# Patient Record
Sex: Female | Born: 1975
Health system: Southern US, Community
[De-identification: ages and names within clinical notes are randomized; demographics above are authoritative.]

## PROBLEM LIST (undated history)

## (undated) DIAGNOSIS — J45909 Unspecified asthma, uncomplicated: Secondary | ICD-10-CM

## (undated) DIAGNOSIS — R569 Unspecified convulsions: Secondary | ICD-10-CM

## (undated) HISTORY — DX: Unspecified asthma, uncomplicated: J45.909

## (undated) HISTORY — PX: CHOLECYSTECTOMY: SHX55

## (undated) HISTORY — PX: TUBAL LIGATION: SHX77

## (undated) HISTORY — DX: Unspecified convulsions: R56.9

---

## 1998-03-21 ENCOUNTER — Inpatient Hospital Stay (HOSPITAL_COMMUNITY): Admission: AD | Admit: 1998-03-21 | Discharge: 1998-03-21 | Payer: Self-pay | Admitting: *Deleted

## 1998-03-22 ENCOUNTER — Ambulatory Visit (HOSPITAL_COMMUNITY): Admission: RE | Admit: 1998-03-22 | Discharge: 1998-03-22 | Payer: Self-pay | Admitting: *Deleted

## 1998-03-23 ENCOUNTER — Inpatient Hospital Stay (HOSPITAL_COMMUNITY): Admission: AD | Admit: 1998-03-23 | Discharge: 1998-03-23 | Payer: Self-pay | Admitting: Obstetrics & Gynecology

## 1998-03-30 ENCOUNTER — Ambulatory Visit (HOSPITAL_COMMUNITY): Admission: RE | Admit: 1998-03-30 | Discharge: 1998-03-30 | Payer: Self-pay | Admitting: *Deleted

## 1999-01-29 ENCOUNTER — Emergency Department (HOSPITAL_COMMUNITY): Admission: EM | Admit: 1999-01-29 | Discharge: 1999-01-29 | Payer: Self-pay | Admitting: Emergency Medicine

## 1999-09-14 ENCOUNTER — Emergency Department (HOSPITAL_COMMUNITY): Admission: EM | Admit: 1999-09-14 | Discharge: 1999-09-14 | Payer: Self-pay | Admitting: Emergency Medicine

## 2000-03-19 ENCOUNTER — Inpatient Hospital Stay (HOSPITAL_COMMUNITY): Admission: AD | Admit: 2000-03-19 | Discharge: 2000-03-19 | Payer: Self-pay | Admitting: *Deleted

## 2000-03-25 ENCOUNTER — Ambulatory Visit (HOSPITAL_COMMUNITY): Admission: RE | Admit: 2000-03-25 | Discharge: 2000-03-25 | Payer: Self-pay | Admitting: *Deleted

## 2000-08-21 ENCOUNTER — Inpatient Hospital Stay (HOSPITAL_COMMUNITY): Admission: AD | Admit: 2000-08-21 | Discharge: 2000-08-21 | Payer: Self-pay | Admitting: *Deleted

## 2000-08-25 ENCOUNTER — Ambulatory Visit (HOSPITAL_COMMUNITY): Admission: RE | Admit: 2000-08-25 | Discharge: 2000-08-25 | Payer: Self-pay | Admitting: Obstetrics & Gynecology

## 2000-08-28 ENCOUNTER — Inpatient Hospital Stay (HOSPITAL_COMMUNITY): Admission: AD | Admit: 2000-08-28 | Discharge: 2000-08-28 | Payer: Self-pay | Admitting: Obstetrics & Gynecology

## 2000-08-30 ENCOUNTER — Inpatient Hospital Stay (HOSPITAL_COMMUNITY): Admission: AD | Admit: 2000-08-30 | Discharge: 2000-09-02 | Payer: Self-pay | Admitting: Obstetrics

## 2001-10-01 ENCOUNTER — Inpatient Hospital Stay (HOSPITAL_COMMUNITY): Admission: AD | Admit: 2001-10-01 | Discharge: 2001-10-01 | Payer: Self-pay | Admitting: Obstetrics & Gynecology

## 2019-06-28 ENCOUNTER — Ambulatory Visit: Payer: Self-pay | Attending: Nurse Practitioner | Admitting: Nurse Practitioner

## 2019-09-27 ENCOUNTER — Encounter: Payer: Self-pay | Admitting: Family Medicine

## 2019-09-27 ENCOUNTER — Ambulatory Visit: Payer: Self-pay | Attending: Family Medicine | Admitting: Family Medicine

## 2019-09-27 ENCOUNTER — Other Ambulatory Visit: Payer: Self-pay

## 2019-09-27 VITALS — BP 107/72 | HR 109 | Temp 98.0°F | Ht 59.0 in | Wt 183.0 lb

## 2019-09-27 DIAGNOSIS — J4540 Moderate persistent asthma, uncomplicated: Secondary | ICD-10-CM

## 2019-09-27 DIAGNOSIS — R569 Unspecified convulsions: Secondary | ICD-10-CM

## 2019-09-27 DIAGNOSIS — M25511 Pain in right shoulder: Secondary | ICD-10-CM

## 2019-09-27 DIAGNOSIS — G4489 Other headache syndrome: Secondary | ICD-10-CM

## 2019-09-27 DIAGNOSIS — G40909 Epilepsy, unspecified, not intractable, without status epilepticus: Secondary | ICD-10-CM | POA: Insufficient documentation

## 2019-09-27 DIAGNOSIS — J45909 Unspecified asthma, uncomplicated: Secondary | ICD-10-CM | POA: Insufficient documentation

## 2019-09-27 MED ORDER — FLUTICASONE-SALMETEROL 250-50 MCG/DOSE IN AEPB: 1.0000 | INHALATION_SPRAY | Freq: Two times a day (BID) | RESPIRATORY_TRACT | 3 refills | Status: DC

## 2019-09-27 MED ORDER — TOPIRAMATE 50 MG PO TABS: 50.0000 mg | ORAL_TABLET | Freq: Two times a day (BID) | ORAL | 3 refills | Status: DC

## 2019-09-27 MED ORDER — DICLOFENAC SODIUM 75 MG PO TBEC: 75.0000 mg | DELAYED_RELEASE_TABLET | Freq: Two times a day (BID) | ORAL | 0 refills | Status: DC

## 2019-09-27 MED ORDER — LEVETIRACETAM 750 MG PO TABS: 750.0000 mg | ORAL_TABLET | Freq: Two times a day (BID) | ORAL | 3 refills | Status: DC

## 2019-09-27 MED ORDER — ALBUTEROL SULFATE HFA 108 (90 BASE) MCG/ACT IN AERS: 2.0000 | INHALATION_SPRAY | Freq: Four times a day (QID) | RESPIRATORY_TRACT | 3 refills | Status: DC | PRN

## 2019-09-27 MED FILL — levETIRAcetam 750 MG TABS: 750 | 30 days supply | Qty: 60 | Fill #0

## 2019-09-27 MED FILL — TOPIRAMATE 50 MG TABLET: 50 | 30 days supply | Qty: 60 | Fill #0

## 2019-09-27 MED FILL — !PROVENTIL HFA 90 MCG INH: 108 (90 BAS | 25 days supply | Qty: 7 | Fill #0

## 2019-09-27 MED FILL — !ADVAIR 250/50 DISKUS: 250-50 | 30 days supply | Qty: 60 | Fill #0

## 2019-09-27 MED FILL — DICLOFENAC SOD EC 75 MG TAB: 75 | 30 days supply | Qty: 60 | Fill #0

## 2019-09-27 NOTE — Progress Notes (Signed)
Patient is having pain in right arm and also having pain in feet and lower back.  Patient has been having headaches with blurred vision.

## 2019-09-27 NOTE — Patient Instructions (Addendum)
Please commence Topamax for your headaches and Diclofenac for your right shoulder pain.

## 2019-09-27 NOTE — Progress Notes (Signed)
Subjective:  Patient ID: Amber Clarke, female    DOB: 1975-12-13  Age: 43 y.o. MRN: [id number]  CC: New Patient (Initial Visit)   HPI Amber Clarke is a 43 year old female with a h/o Asthma, Seizure here to establish care. Relocated from [place] 3 months ago. She usually has absence seizures; none in the last 6 months. Complains of headaches for one month that make her vision blurry and this would last for 2 days with associated photophobia, phonobia. Had migraines in the past but was never on medication (previously tried imitrex but had sinus pain). Asthma is controlled on albuterol and Advair and she rarely has flares.. She has had anterior and axillary right shoulder pain with some tenderness on range of motion but no history of trauma.  Pain is described as mild She had a visit with her PCP prior to her relocating here and had a physical and Pap smear as well as labs.  Past Medical History:  Diagnosis Date  . Asthma   . Seizures (HCC)       Family History  Problem Relation Age of Onset  . Hypertension Mother   . Diabetes Mother   . COPD Mother   . Hypertension Father   . Heart disease Sister   . Heart disease Maternal Grandmother     Allergies  Allergen Reactions  . Keflex [Cephalexin]     Outpatient Medications Prior to Visit  Medication Sig Dispense Refill  . albuterol (ACCUNEB) 1.25 MG/3ML nebulizer solution Take 1 ampule by nebulization every 6 (six) hours as needed for wheezing.    Marland Kitchen albuterol (PROVENTIL HFA) 108 (90 Base) MCG/ACT inhaler Inhale into the lungs every 6 (six) hours as needed for wheezing or shortness of breath.    . levETIRAcetam (KEPPRA) 750 MG tablet Take 750 mg by mouth 2 (two) times daily.     No facility-administered medications prior to visit.     ROS Review of Systems  Constitutional: Negative for activity change, appetite change and fatigue.  HENT: Negative for congestion, sinus pressure and sore throat.   Eyes: Negative for  visual disturbance.  Respiratory: Negative for cough, chest tightness, shortness of breath and wheezing.   Cardiovascular: Negative for chest pain and palpitations.  Gastrointestinal: Negative for abdominal distention, abdominal pain and constipation.  Endocrine: Negative for polydipsia.  Genitourinary: Negative for dysuria and frequency.  Musculoskeletal: Negative for arthralgias and back pain.  Skin: Negative for rash.  Neurological: Negative for tremors, light-headedness and numbness.  Hematological: Does not bruise/bleed easily.  Psychiatric/Behavioral: Negative for agitation and behavioral problems.    Objective:  BP 107/72   Pulse (!) 109   Temp 98 F (36.7 C) (Oral)   Ht 4\' 11"  (1.499 m)   Wt 183 lb (83 kg)   SpO2 93%   BMI 36.96 kg/m   BP/Weight 09/27/2019  Systolic BP 107  Diastolic BP 72  Wt. (Lbs) 183  BMI 36.96      Physical Exam Constitutional:      Appearance: She is well-developed.  Neck:     Vascular: No JVD.  Cardiovascular:     Rate and Rhythm: Normal rate.     Heart sounds: Normal heart sounds. No murmur.  Pulmonary:     Effort: Pulmonary effort is normal.     Breath sounds: Normal breath sounds. No wheezing or rales.  Chest:     Chest wall: No tenderness.  Abdominal:     General: Bowel sounds are normal. There is no distension.  Palpations: Abdomen is soft. There is no mass.     Tenderness: There is no abdominal tenderness.  Musculoskeletal:        General: Normal range of motion.     Right lower leg: No edema.     Left lower leg: No edema.     Comments: Right shoulder appears normal, slight tenderness to palpation in the anterior and axillary region Hawkins and Neer signs are normal Hand grip is normal  Neurological:     Mental Status: She is alert and oriented to person, place, and time.  Psychiatric:        Mood and Affect: Mood normal.       Assessment & Plan:   1. Moderate persistent asthma without  complication Controlled No regimen change today - albuterol (ACCUNEB) 1.25 MG/3ML nebulizer solution; Take 1 ampule by nebulization every 6 (six) hours as needed for wheezing. - Fluticasone-Salmeterol (ADVAIR DISKUS) 250-50 MCG/DOSE AEPB; Inhale 1 puff into the lungs 2 (two) times daily.  Dispense: 1 each; Refill: 3 - albuterol (PROVENTIL HFA) 108 (90 Base) MCG/ACT inhaler; Inhale 2 puffs into the lungs every 6 (six) hours as needed for wheezing or shortness of breath.  Dispense: 18 g; Refill: 3  2. Seizures ([place]) Stable Advised of [place] driving laws-no driving until 6 months seizure-free - levETIRAcetam (KEPPRA) 750 MG tablet; Take 1 tablet (750 mg total) by mouth 2 (two) times daily.  Dispense: 60 tablet; Refill: 3  3. Other headache syndrome We will treat for migraines Commence Topamax-side effects discussed; she is status post tubal ligation - topiramate (TOPAMAX) 50 MG tablet; Take 1 tablet (50 mg total) by mouth 2 (two) times daily.  Dispense: 60 tablet; Refill: 3  4. Acute pain of right shoulder Cannot exclude early underlying osteoarthritis - diclofenac (VOLTAREN) 75 MG EC tablet; Take 1 tablet (75 mg total) by mouth 2 (two) times daily.  Dispense: 60 tablet; Refill: 0  Health Care Maintenance: Up-to-date per patient with previous PCP.  Will request records. No orders of the defined types were placed in this encounter.   Follow-up: Return in about 3 months (around 12/26/2019) for Medical conditions.       Charlott Rakes, MD, FAAFP. [facility] and [place], [place]   09/27/2019, 2:20 PM

## 2019-10-26 ENCOUNTER — Ambulatory Visit: Payer: Self-pay | Attending: Internal Medicine

## 2019-10-26 DIAGNOSIS — Z20822 Contact with and (suspected) exposure to covid-19: Secondary | ICD-10-CM | POA: Insufficient documentation

## 2019-10-28 ENCOUNTER — Other Ambulatory Visit: Payer: Self-pay

## 2019-10-28 LAB — NOVEL CORONAVIRUS, NAA: SARS-CoV-2, NAA: NOT DETECTED

## 2019-11-08 MED FILL — !PROVENTIL HFA 90 MCG INH: 108 (90 BAS | 25 days supply | Qty: 7 | Fill #1

## 2019-11-08 MED FILL — levETIRAcetam 750 MG TABS: 750 | 30 days supply | Qty: 60 | Fill #1

## 2019-12-08 ENCOUNTER — Ambulatory Visit: Payer: Self-pay

## 2019-12-28 ENCOUNTER — Ambulatory Visit: Payer: Medicaid Other | Attending: Family Medicine | Admitting: Family Medicine

## 2019-12-28 ENCOUNTER — Other Ambulatory Visit: Payer: Self-pay

## 2019-12-28 DIAGNOSIS — J4540 Moderate persistent asthma, uncomplicated: Secondary | ICD-10-CM

## 2019-12-28 DIAGNOSIS — G4489 Other headache syndrome: Secondary | ICD-10-CM

## 2019-12-28 DIAGNOSIS — M25512 Pain in left shoulder: Secondary | ICD-10-CM | POA: Diagnosis not present

## 2019-12-28 DIAGNOSIS — R569 Unspecified convulsions: Secondary | ICD-10-CM

## 2019-12-28 DIAGNOSIS — G8929 Other chronic pain: Secondary | ICD-10-CM

## 2019-12-28 MED ORDER — FLUTICASONE-SALMETEROL 250-50 MCG/DOSE IN AEPB: 1.0000 | INHALATION_SPRAY | Freq: Two times a day (BID) | RESPIRATORY_TRACT | 6 refills | Status: DC

## 2019-12-28 MED ORDER — PREDNISONE 20 MG PO TABS: 20.0000 mg | ORAL_TABLET | Freq: Every day | ORAL | 0 refills | Status: DC

## 2019-12-28 MED ORDER — TIZANIDINE HCL 4 MG PO TABS: 4.0000 mg | ORAL_TABLET | Freq: Four times a day (QID) | ORAL | 2 refills | Status: DC | PRN

## 2019-12-28 MED ORDER — LEVETIRACETAM 750 MG PO TABS: 750.0000 mg | ORAL_TABLET | Freq: Two times a day (BID) | ORAL | 6 refills | Status: DC

## 2019-12-28 MED ORDER — ALBUTEROL SULFATE HFA 108 (90 BASE) MCG/ACT IN AERS: 2.0000 | INHALATION_SPRAY | Freq: Four times a day (QID) | RESPIRATORY_TRACT | 6 refills | Status: DC | PRN

## 2019-12-28 MED ORDER — MONTELUKAST SODIUM 10 MG PO TABS: 10.0000 mg | ORAL_TABLET | Freq: Every day | ORAL | 6 refills | Status: DC

## 2019-12-28 MED ORDER — MELOXICAM 7.5 MG PO TABS: 7.5000 mg | ORAL_TABLET | Freq: Every day | ORAL | 2 refills | Status: DC

## 2019-12-28 NOTE — Progress Notes (Signed)
Patient has been called and DOB has been verified. Patient has been screened and transferred to PCP to start phone visit.   Needs refills on all medications. 

## 2019-12-28 NOTE — Progress Notes (Signed)
Virtual Visit via Telephone Note  I connected with Amber Clarke, on 12/28/2019 at 1:35 PM by telephone due to the COVID-19 pandemic and verified that I am speaking with the correct person using two identifiers.   Consent: I discussed the limitations, risks, security and privacy concerns of performing an evaluation and management service by telephone and the availability of in person appointments. I also discussed with the patient that there may be a patient responsible charge related to this service. The patient expressed understanding and agreed to proceed.   Location of Patient: Home  Location of Provider: Clinic   Persons participating in Telemedicine visit: Creasie Lacosse Far[place]-CMA Dr. Alvis Lemmings     History of Present Illness: She is a 44 year old female with a h/o Asthma, Seizure, migraines seen for follow-up visit. She is R handed.  At her last visit she had complained of right shoulder pain but today states it was actually her left not her right. Complains of L shoulder Pain which is 8/10 and she denies numbness, tries not to lift heavy things but denies dropping things  ROM is associated with pain. Taking sister's Meloxicam which has been ineffective but her mother's tizanidine has been effective.    Migraines are getting better and Topamax which was started at her last visit has been effective but she does not take it regularly due to side effects. Migraines occur every 3 days and she is working on lowering her stress which has been helpful in controlling her migraine. Last seizure was 9 months ago; doing well on Keppra. Complains asthma is not controlled as she has been having flares and is wondering if Advair dose can be increased.  Also wondering if she needs prednisone.  Past Medical History:  Diagnosis Date  . Asthma   . Seizures (HCC)    Allergies  Allergen Reactions  . Keflex [Cephalexin]     Current Outpatient Medications on File Prior to Visit   Medication Sig Dispense Refill  . albuterol (ACCUNEB) 1.25 MG/3ML nebulizer solution Take 1 ampule by nebulization every 6 (six) hours as needed for wheezing.    Marland Kitchen albuterol (PROVENTIL HFA) 108 (90 Base) MCG/ACT inhaler Inhale 2 puffs into the lungs every 6 (six) hours as needed for wheezing or shortness of breath. 18 g 3  . diclofenac (VOLTAREN) 75 MG EC tablet Take 1 tablet (75 mg total) by mouth 2 (two) times daily. 60 tablet 0  . Fluticasone-Salmeterol (ADVAIR DISKUS) 250-50 MCG/DOSE AEPB Inhale 1 puff into the lungs 2 (two) times daily. 1 each 3  . levETIRAcetam (KEPPRA) 750 MG tablet Take 1 tablet (750 mg total) by mouth 2 (two) times daily. 60 tablet 3  . topiramate (TOPAMAX) 50 MG tablet Take 1 tablet (50 mg total) by mouth 2 (two) times daily. (Patient not taking: Reported on 12/28/2019) 60 tablet 3   No current facility-administered medications on file prior to visit.    Observations/Objective: Awake, alert, oriented x3 Not in acute distress  Assessment and Plan: 1. Seizures (HCC) Controlled - levETIRAcetam (KEPPRA) 750 MG tablet; Take 1 tablet (750 mg total) by mouth 2 (two) times daily.  Dispense: 60 tablet; Refill: 6  2. Moderate persistent asthma without complication Uncontrolled Short course of prednisone We will add Singulair to her regimen - Fluticasone-Salmeterol (ADVAIR DISKUS) 250-50 MCG/DOSE AEPB; Inhale 1 puff into the lungs 2 (two) times daily.  Dispense: 1 each; Refill: 6 - montelukast (SINGULAIR) 10 MG tablet; Take 1 tablet (10 mg total) by mouth at bedtime.  Dispense: 30 tablet; Refill: 6 - albuterol (PROVENTIL HFA) 108 (90 Base) MCG/ACT inhaler; Inhale 2 puffs into the lungs every 6 (six) hours as needed for wheezing or shortness of breath.  Dispense: 18 g; Refill: 6 - predniSONE (DELTASONE) 20 MG tablet; Take 1 tablet (20 mg total) by mouth daily with breakfast.  Dispense: 5 tablet; Refill: 0  3. Chronic left shoulder pain Uncontrolled We will add  tizanidine and meloxicam; discontinue diclofenac Referral to PT - tiZANidine (ZANAFLEX) 4 MG tablet; Take 1 tablet (4 mg total) by mouth every 6 (six) hours as needed for muscle spasms.  Dispense: 60 tablet; Refill: 2 - meloxicam (MOBIC) 7.5 MG tablet; Take 1 tablet (7.5 mg total) by mouth daily.  Dispense: 30 tablet; Refill: 2 - Ambulatory referral to Physical Therapy  4. Other headache syndrome Not taking Topamax as prescribed Advised she can take half a tablet twice daily for prophylaxis Reducing her stress level has been beneficial Continue behavioral modifications to limit flares   Follow Up Instructions: 3 months   I discussed the assessment and treatment plan with the patient. The patient was provided an opportunity to ask questions and all were answered. The patient agreed with the plan and demonstrated an understanding of the instructions.   The patient was advised to call back or seek an in-person evaluation if the symptoms worsen or if the condition fails to improve as anticipated.     I provided 17 minutes total of non-face-to-face time during this encounter including median intraservice time, reviewing previous notes, investigations, ordering medications, medical decision making, coordinating care and patient verbalized understanding at the end of the visit.     Charlott Rakes, MD, FAAFP. [facility] and [place], [place]   12/28/2019, 1:35 PM

## 2019-12-29 ENCOUNTER — Encounter: Payer: Self-pay | Admitting: Family Medicine

## 2020-01-17 ENCOUNTER — Ambulatory Visit: Payer: Medicaid Other | Attending: Family Medicine | Admitting: Physical Therapy

## 2020-04-12 ENCOUNTER — Other Ambulatory Visit: Payer: Self-pay | Admitting: Family Medicine

## 2020-04-12 DIAGNOSIS — M25512 Pain in left shoulder: Secondary | ICD-10-CM

## 2020-04-12 NOTE — Telephone Encounter (Signed)
Requested Prescriptions  Pending Prescriptions Disp Refills  . meloxicam (MOBIC) 7.5 MG tablet [Pharmacy Med Name: MELOXICAM 7.5MG  TABLET] 30 tablet 2    Sig: TAKE 1 TABLET (7.5 MG TOTAL) BY MOUTH DAILY.     Analgesics:  COX2 Inhibitors Failed - 04/12/2020  6:01 PM      Failed - HGB in normal range and within 360 days    No results found for: HGB, HGBKUC, HGBPOCKUC, HGBOTHER, TOTHGB, HGBPLASMA       Failed - Cr in normal range and within 360 days    No results found for: CREATININE, LABCREAU, LABCREA, POCCRE       Passed - Patient is not pregnant      Passed - Valid encounter within last 12 months    Recent Outpatient Visits          3 months ago Chronic left shoulder pain   [facility] [place], Odette Horns, MD   6 months ago Other headache syndrome   [facility] [place], [place], MD             . tiZANidine (ZANAFLEX) 4 MG tablet [Pharmacy Med Name: TIZANIDINE HCL 4MG  TABLET] 60 tablet 2    Sig: TAKE 1 TABLET (4 MG TOTAL) BY MOUTH EVERY 6 (SIX) HOURS AS NEEDED FOR MUSCLE SPASMS.     Not Delegated - Cardiovascular:  Alpha-2 Agonists - tizanidine Failed - 04/12/2020  6:01 PM      Failed - This refill cannot be delegated      Passed - Valid encounter within last 6 months    Recent Outpatient Visits          3 months ago Chronic left shoulder pain   [facility] 04/14/2020, MD   6 months ago Other headache syndrome   [facility], MD

## 2020-04-12 NOTE — Telephone Encounter (Signed)
Requested medication (s) are due for refill today: yes  Requested medication (s) are on the active medication list: yes  Last refill: ?  Future visit scheduled: no  Notes to clinic:  not delegated   Requested Prescriptions  Pending Prescriptions Disp Refills   tiZANidine (ZANAFLEX) 4 MG tablet [Pharmacy Med Name: TIZANIDINE HCL 4MG  TABLET] 60 tablet 2    Sig: TAKE 1 TABLET (4 MG TOTAL) BY MOUTH EVERY 6 (SIX) HOURS AS NEEDED FOR MUSCLE SPASMS.      Not Delegated - Cardiovascular:  Alpha-2 Agonists - tizanidine Failed - 04/12/2020  6:01 PM      Failed - This refill cannot be delegated      Passed - Valid encounter within last 6 months    Recent Outpatient Visits           3 months ago Chronic left shoulder pain   [facility] [place], [place], MD   6 months ago Other headache syndrome   [facility] [place], [place], MD               Signed Prescriptions Disp Refills   meloxicam (MOBIC) 7.5 MG tablet 30 tablet 2    Sig: TAKE 1 TABLET (7.5 MG TOTAL) BY MOUTH DAILY.      Analgesics:  COX2 Inhibitors Failed - 04/12/2020  6:01 PM      Failed - HGB in normal range and within 360 days    No results found for: HGB, HGBKUC, HGBPOCKUC, HGBOTHER, TOTHGB, HGBPLASMA        Failed - Cr in normal range and within 360 days    No results found for: CREATININE, LABCREAU, LABCREA, POCCRE        Passed - Patient is not pregnant      Passed - Valid encounter within last 12 months    Recent Outpatient Visits           3 months ago Chronic left shoulder pain   [facility] [place], [place], MD   6 months ago Other headache syndrome   [facility] Odette Horns, MD

## 2020-05-30 ENCOUNTER — Ambulatory Visit: Payer: Medicaid Other | Admitting: Family Medicine

## 2020-08-23 ENCOUNTER — Other Ambulatory Visit: Payer: Self-pay | Admitting: Family Medicine

## 2020-08-23 DIAGNOSIS — M25512 Pain in left shoulder: Secondary | ICD-10-CM

## 2020-08-23 DIAGNOSIS — G8929 Other chronic pain: Secondary | ICD-10-CM

## 2020-11-03 ENCOUNTER — Other Ambulatory Visit: Payer: Self-pay | Admitting: Family Medicine

## 2020-11-03 DIAGNOSIS — M25512 Pain in left shoulder: Secondary | ICD-10-CM

## 2020-11-03 NOTE — Telephone Encounter (Signed)
Requested medication (s) are due for refill today:   Provider to determine  Requested medication (s) are on the active medication list:   Yes  Future visit scheduled:   No   Last ordered: 04/20/2020 #60, 2 refills  Clinic Note:  Non delegated refill   Requested Prescriptions  Pending Prescriptions Disp Refills   tiZANidine (ZANAFLEX) 4 MG tablet [Pharmacy Med Name: TIZANIDINE HCL 4 MG ORAL TABLET] 60 tablet 2    Sig: TAKE 1 TABLET (4 MG TOTAL) BY MOUTH EVERY 6 (SIX) HOURS AS NEEDED FOR MUSCLE SPASMS.      Not Delegated - Cardiovascular:  Alpha-2 Agonists - tizanidine Failed - 11/03/2020 12:40 PM      Failed - This refill cannot be delegated      Failed - Valid encounter within last 6 months    Recent Outpatient Visits           10 months ago Chronic left shoulder pain   [facility] Hoy Register, MD   1 year ago Other headache syndrome   [facility] Hoy Register, MD

## 2020-11-21 ENCOUNTER — Other Ambulatory Visit: Payer: Self-pay | Admitting: Family Medicine

## 2020-11-21 DIAGNOSIS — R569 Unspecified convulsions: Secondary | ICD-10-CM

## 2020-11-21 NOTE — Telephone Encounter (Signed)
Requested medication (s) are due for refill today: no  Requested medication (s) are on the active medication list:  yes  Last refill:  11/21/2020  Future visit scheduled: no  Notes to clinic:  this refill cannot be delegated    Requested Prescriptions  Pending Prescriptions Disp Refills   levETIRAcetam (KEPPRA) 750 MG tablet [Pharmacy Med Name: LEVETIRACETAM 750 MG ORAL TABLET] 60 tablet 6    Sig: Take 1 tablet (750 mg total) by mouth 2 (two) times daily.      Not Delegated - Neurology:  Anticonvulsants Failed - 11/21/2020  9:31 AM      Failed - This refill cannot be delegated      Failed - HCT in normal range and within 360 days    No results found for: HCT, HCTKUC, SRHCT        Failed - HGB in normal range and within 360 days    No results found for: HGB, HGBKUC, HGBPOCKUC, HGBOTHER, TOTHGB, HGBPLASMA        Failed - PLT in normal range and within 360 days    No results found for: PLT, PLTCOUNTKUC, LABPLAT, POCPLA        Failed - WBC in normal range and within 360 days    No results found for: WBC, WBCKUC        Passed - Valid encounter within last 12 months    Recent Outpatient Visits           10 months ago Chronic left shoulder pain   [facility] Hoy Register, MD   1 year ago Other headache syndrome   [facility] Hoy Register, MD

## 2021-01-05 ENCOUNTER — Other Ambulatory Visit: Payer: Self-pay | Admitting: Adult Medicine

## 2021-01-05 DIAGNOSIS — Z1231 Encounter for screening mammogram for malignant neoplasm of breast: Secondary | ICD-10-CM

## 2021-01-10 ENCOUNTER — Emergency Department (HOSPITAL_COMMUNITY)
Admission: EM | Admit: 2021-01-10 | Discharge: 2021-01-10 | Disposition: A | Discharge status: Home / Self Care | Payer: Medicaid Other | Attending: Emergency Medicine | Admitting: Emergency Medicine

## 2021-01-10 ENCOUNTER — Encounter (HOSPITAL_COMMUNITY): Payer: Self-pay | Admitting: Emergency Medicine

## 2021-01-10 ENCOUNTER — Emergency Department (HOSPITAL_COMMUNITY): Payer: Medicaid Other

## 2021-01-10 ENCOUNTER — Other Ambulatory Visit: Payer: Self-pay

## 2021-01-10 DIAGNOSIS — L299 Pruritus, unspecified: Secondary | ICD-10-CM | POA: Diagnosis not present

## 2021-01-10 DIAGNOSIS — R000 Tachycardia, unspecified: Secondary | ICD-10-CM | POA: Insufficient documentation

## 2021-01-10 DIAGNOSIS — J3489 Other specified disorders of nose and nasal sinuses: Secondary | ICD-10-CM | POA: Insufficient documentation

## 2021-01-10 DIAGNOSIS — F17200 Nicotine dependence, unspecified, uncomplicated: Secondary | ICD-10-CM | POA: Diagnosis not present

## 2021-01-10 DIAGNOSIS — J40 Bronchitis, not specified as acute or chronic: Secondary | ICD-10-CM | POA: Diagnosis not present

## 2021-01-10 DIAGNOSIS — R0602 Shortness of breath: Secondary | ICD-10-CM | POA: Diagnosis present

## 2021-01-10 DIAGNOSIS — Z20822 Contact with and (suspected) exposure to covid-19: Secondary | ICD-10-CM | POA: Insufficient documentation

## 2021-01-10 DIAGNOSIS — Z79899 Other long term (current) drug therapy: Secondary | ICD-10-CM | POA: Insufficient documentation

## 2021-01-10 DIAGNOSIS — J4541 Moderate persistent asthma with (acute) exacerbation: Secondary | ICD-10-CM

## 2021-01-10 DIAGNOSIS — D649 Anemia, unspecified: Secondary | ICD-10-CM

## 2021-01-10 LAB — CBC WITH DIFFERENTIAL/PLATELET
Abs Immature Granulocytes: 0.02 10*3/uL (ref 0.00–0.07)
Basophils Absolute: 0.1 10*3/uL (ref 0.0–0.1)
Basophils Relative: 1 %
Eosinophils Absolute: 0.2 10*3/uL (ref 0.0–0.5)
Eosinophils Relative: 2 %
HCT: 36 % (ref 36.0–46.0)
Hemoglobin: 10.7 g/dL — ABNORMAL LOW (ref 12.0–15.0)
Immature Granulocytes: 0 %
Lymphocytes Relative: 20 %
Lymphs Abs: 1.6 10*3/uL (ref 0.7–4.0)
MCH: 23.6 pg — ABNORMAL LOW (ref 26.0–34.0)
MCHC: 29.7 g/dL — ABNORMAL LOW (ref 30.0–36.0)
MCV: 79.3 fL — ABNORMAL LOW (ref 80.0–100.0)
Monocytes Absolute: 0.3 10*3/uL (ref 0.1–1.0)
Monocytes Relative: 4 %
Neutro Abs: 5.7 10*3/uL (ref 1.7–7.7)
Neutrophils Relative %: 73 %
Platelets: 438 10*3/uL — ABNORMAL HIGH (ref 150–400)
RBC: 4.54 MIL/uL (ref 3.87–5.11)
RDW: 19.9 % — ABNORMAL HIGH (ref 11.5–15.5)
WBC: 7.9 10*3/uL (ref 4.0–10.5)
nRBC: 0 % (ref 0.0–0.2)

## 2021-01-10 LAB — RESP PANEL BY RT-PCR (FLU A&B, COVID) ARPGX2
Influenza A by PCR: NEGATIVE
Influenza B by PCR: NEGATIVE
SARS Coronavirus 2 by RT PCR: NEGATIVE

## 2021-01-10 LAB — BASIC METABOLIC PANEL
Anion gap: 6 (ref 5–15)
BUN: 5 mg/dL — ABNORMAL LOW (ref 6–20)
CO2: 24 mmol/L (ref 22–32)
Calcium: 9 mg/dL (ref 8.9–10.3)
Chloride: 106 mmol/L (ref 98–111)
Creatinine, Ser: 0.84 mg/dL (ref 0.44–1.00)
GFR, Estimated: >60 mL/min (ref >60)
Glucose, Bld: 115 mg/dL — ABNORMAL HIGH (ref 70–99)
Potassium: 3.9 mmol/L (ref 3.5–5.1)
Sodium: 136 mmol/L (ref 135–145)

## 2021-01-10 LAB — I-STAT BETA HCG BLOOD, ED (MC, WL, AP ONLY): I-stat hCG, quantitative: <5 m[IU]/mL (ref <5)

## 2021-01-10 MED ORDER — BENZONATATE 100 MG PO CAPS
200.0000 mg | ORAL_CAPSULE | Freq: Once | ORAL | Status: AC
  Administered 2021-01-10: 200 mg via ORAL
  Filled 2021-01-10: qty 2

## 2021-01-10 MED ORDER — DOXYCYCLINE HYCLATE 100 MG PO CAPS: 100.0000 mg | ORAL_CAPSULE | Freq: Two times a day (BID) | ORAL | 0 refills | Status: DC

## 2021-01-10 MED ORDER — IPRATROPIUM BROMIDE 0.02 % IN SOLN
0.5000 mg | Freq: Once | RESPIRATORY_TRACT | Status: AC
  Administered 2021-01-10: 0.5 mg via RESPIRATORY_TRACT
  Filled 2021-01-10: qty 2.5

## 2021-01-10 MED ORDER — ALBUTEROL SULFATE HFA 108 (90 BASE) MCG/ACT IN AERS: 1.0000 | INHALATION_SPRAY | Freq: Four times a day (QID) | RESPIRATORY_TRACT | 0 refills | Status: DC | PRN

## 2021-01-10 MED ORDER — PREDNISONE 10 MG (21) PO TBPK: ORAL_TABLET | ORAL | 0 refills | Status: DC

## 2021-01-10 MED ORDER — BENZONATATE 100 MG PO CAPS: 200.0000 mg | ORAL_CAPSULE | Freq: Three times a day (TID) | ORAL | 0 refills | Status: AC

## 2021-01-10 MED ORDER — ALBUTEROL (5 MG/ML) CONTINUOUS INHALATION SOLN
10.0000 mg/h | INHALATION_SOLUTION | Freq: Once | RESPIRATORY_TRACT | Status: AC
  Administered 2021-01-10: 10 mg/h via RESPIRATORY_TRACT
  Filled 2021-01-10: qty 20

## 2021-01-10 NOTE — ED Notes (Signed)
Xray bedside.

## 2021-01-10 NOTE — Discharge Instructions (Addendum)
You were seen in the emergency department for cough, wheezing, shortness of breath  Lab work and chest x-ray are unremarkable.  Your hemoglobin is 10.7, this is lower than normal but not severe.  Please discuss this with your primary care doctor.  Respiratory panel was negative  I think your symptoms are from an asthma flare or bronchitis  Please use fluticasone nasal spray (Flonase) 1 spray in each nostril in the morning and at night.  Take an over-the-counter allergy medicine like cetirizine, loratadine, etc every morning.  Take doxycycline morning and night, this is an antibiotic that can treat bronchitis  Take and finish prednisone taper as prescribed  For cough, take over-the-counter dextromethorphan (Delsym) and take every morning and evening for 5 days.  Take tessalon perles 200 mg every 8 hours.    Continue using albuterol nebulizer treatment every 4 hours for the next 2 days.  Supplement with albuterol inhaler as needed  Call your primary care doctor and schedule an appointment in the next 48 to 72 hours if symptoms have not significantly improved  Return to the ED for worsening symptoms  Good job on cutting back on cigarette use, encouraged you to completely stop smoking

## 2021-01-10 NOTE — ED Triage Notes (Signed)
Per EMS, pt from work, SOB X90 minutes, rescue inhaler not helping asthma.  Given 10 albuterol, .5 Atrovent, 125 Solumedrol.  Initially wheezing in on fields now only left sided wheezing.  Pt does not appear to be in any respiratory distress at this time.

## 2021-01-10 NOTE — ED Provider Notes (Signed)
[facility] EMERGENCY DEPARTMENT Provider Note   CSN: [id number] Arrival date & time: 01/10/21  3734     History Chief Complaint  Patient presents with  . Shortness of Breath    Amber Clarke is a 45 y.o. female with past medical history of asthma, tobacco use presents to the ED for evaluation of "asthma".  Endorses shortness of breath, wheezing, productive cough for the last week.  Cough is productive of green phlegm.  Cough is worse at night.  Associated with runny nose, itchy eyes and irritated throat that she attributes to constant coughing.  Does have seasonal allergies that typically worsen at this time of the year.  Has tried to cut back on cigarette use, from 10 cigarettes a day now to 2-3 a day.  Unvaccinated for COVID but had Covid May 2021.  She had a at home Covid test 3 days ago that was negative.  No sick contacts.  No recent travel.  No hemoptysis.  No fevers, chills.  No chest pain or tightness.  No vomiting, diarrhea, abdominal pain.  Patient has albuterol nebulizer and inhaler at home.  Has been using albuterol nebulizing every morning and at night and albuterol inhaler "obsessively" throughout the day.  This has not been helping.  Reports requiring admission for asthma many years ago.  Has never required ICU admission or intubations.  Patient symptoms worsen in the last 90 minutes while at work.  EMS was called.  She was given 10 mg albuterol, 0.5 mg Atrovent, 100 [place] Solu-Medrol.  Reports some improvement about 50% better.  Per EMS wheezing has improved as well on repeat auscultation.   HPI     Past Medical History:  Diagnosis Date  . Asthma   . Seizures [facility])     Patient Active Problem List   Diagnosis Date Noted  . Asthma   . Seizures (HCC)     Past Surgical History:  Procedure Laterality Date  . CHOLECYSTECTOMY    . TUBAL LIGATION       OB History   No obstetric history on file.     Family History  Problem Relation Age of Onset   . Hypertension Mother   . Diabetes Mother   . COPD Mother   . Hypertension Father   . Heart disease Sister   . Heart disease Maternal Grandmother     Social History   Tobacco Use  . Smoking status: Current Every Day Smoker  . Smokeless tobacco: Never Used  Substance Use Topics  . Alcohol use: Not Currently  . Drug use: Not Currently    Home Medications Prior to Admission medications   Medication Sig Start Date End Date Taking? Authorizing Provider  albuterol (PROVENTIL HFA) 108 (90 Base) MCG/ACT inhaler Inhale 2 puffs into the lungs every 6 (six) hours as needed for wheezing or shortness of breath. 12/28/19  Yes Hoy Register, MD  albuterol (VENTOLIN HFA) 108 (90 Base) MCG/ACT inhaler Inhale 1-2 puffs into the lungs every 6 (six) hours as needed for wheezing or shortness of breath. 01/10/21  Yes Sharen Heck J, PA-C  benzonatate (TESSALON PERLES) 100 MG capsule Take 2 capsules (200 mg total) by mouth 3 (three) times daily for 5 days. FOR DISRUPTIVE DAY TIME COUGH 01/10/21 01/15/21 Yes Liberty Handy, PA-C  diclofenac Sodium (VOLTAREN) 1 % GEL Apply 2-4 g topically daily. 12/21/20  Yes [provider]  doxycycline (VIBRAMYCIN) 100 MG capsule Take 1 capsule (100 mg total) by mouth 2 (two) times  daily. 01/10/21  Yes Liberty Handy, PA-C  famotidine (PEPCID) 20 MG tablet Take 20 mg by mouth 2 (two) times daily. 12/26/20  Yes [provider]  gabapentin (NEURONTIN) 300 MG capsule Take 300 mg by mouth 2 (two) times daily. 12/26/20  Yes [provider]  HYDROcodone-acetaminophen (NORCO/VICODIN) 5-325 MG tablet Take 1 tablet by mouth 2 (two) times daily. 12/21/20  Yes [provider]  ipratropium-albuterol (DUONEB) 0.5-2.5 (3) MG/3ML SOLN Take 3 mLs by nebulization 4 (four) times daily. 12/26/20  Yes [provider]  levETIRAcetam (KEPPRA) 750 MG tablet Take 1 tablet (750 mg total) by mouth 2 (two) times daily. 12/28/19  Yes Newlin, Odette Horns, MD   meloxicam (MOBIC) 7.5 MG tablet TAKE 1 TABLET (7.5 MG TOTAL) BY MOUTH DAILY. 08/23/20  Yes Newlin, Odette Horns, MD  montelukast (SINGULAIR) 10 MG tablet Take 1 tablet (10 mg total) by mouth at bedtime. Patient taking differently: Take 10 mg by mouth at bedtime as needed (allergies). 12/28/19  Yes Hoy Register, MD  omeprazole (PRILOSEC) 20 MG capsule Take 20 mg by mouth daily. 12/26/20  Yes [provider]  predniSONE (STERAPRED UNI-PAK 21 TAB) 10 MG (21) TBPK tablet Take prednisone taper as prescribed and until completed 01/10/21  Yes Mearl Harewood J, PA-C  tiZANidine (ZANAFLEX) 4 MG tablet TAKE 1 TABLET (4 MG TOTAL) BY MOUTH EVERY 6 (SIX) HOURS AS NEEDED FOR MUSCLE SPASMS. 04/20/20  Yes Newlin, Enobong, MD  azithromycin (ZITHROMAX) 250 MG tablet Take 250 mg by mouth See admin instructions. 2 tabs day 1. 1 tab days 2-5 Patient not taking: Reported on 01/10/2021 12/26/20   [provider]  DULERA 100-5 MCG/ACT AERO Inhale 2 puffs into the lungs in the morning and at bedtime. Patient not taking: No sig reported 10/10/20   [provider]  Fluticasone-Salmeterol (ADVAIR DISKUS) 250-50 MCG/DOSE AEPB Inhale 1 puff into the lungs 2 (two) times daily. Patient not taking: No sig reported 12/28/19   Hoy Register, MD  predniSONE (DELTASONE) 20 MG tablet Take 1 tablet (20 mg total) by mouth daily with breakfast. Patient not taking: No sig reported 12/28/19   Hoy Register, MD  topiramate (TOPAMAX) 50 MG tablet Take 1 tablet (50 mg total) by mouth 2 (two) times daily. Patient not taking: No sig reported 09/27/19   Hoy Register, MD    Allergies    Keflex [cephalexin] and Pollen extract  Review of Systems   Review of Systems  HENT: Positive for congestion, postnasal drip, rhinorrhea and sore throat.   Respiratory: Positive for cough, shortness of breath and wheezing.   All other systems reviewed and are negative.   Physical Exam Updated Vital Signs BP 123/60   Pulse (!)  116   Temp 97.6 F (36.4 C) (Oral)   Resp (!) 21   Ht 4\' 11"  (1.499 m)   Wt 83 kg   LMP  (LMP Unknown)   SpO2 95%   BMI 36.96 kg/m   Physical Exam Vitals and nursing note reviewed.  Constitutional:      General: She is not in acute distress.    Appearance: She is well-developed.     Comments: NAD.  HENT:     Head: Normocephalic and atraumatic.     Right Ear: External ear normal.     Left Ear: External ear normal.     Nose: Congestion and rhinorrhea present. Rhinorrhea is clear.     Right Turbinates: Enlarged.     Left Turbinates: Enlarged.     Mouth/Throat:  Mouth: No angioedema.     Pharynx: Oropharynx is clear.     Tonsils: No tonsillar exudate or tonsillar abscesses. 0 on the right. 0 on the left.  Eyes:     General: No scleral icterus.    Conjunctiva/sclera: Conjunctivae normal.  Cardiovascular:     Rate and Rhythm: Regular rhythm. Tachycardia present.     Heart sounds: Normal heart sounds. No murmur heard.   Pulmonary:     Effort: Tachypnea present.     Breath sounds: Decreased air movement present. Wheezing present.  Musculoskeletal:        General: No deformity. Normal range of motion.     Cervical back: Normal range of motion and neck supple.  Skin:    General: Skin is warm and dry.     Capillary Refill: Capillary refill takes less than 2 seconds.  Neurological:     Mental Status: She is alert and oriented to person, place, and time.  Psychiatric:        Behavior: Behavior normal.        Thought Content: Thought content normal.        Judgment: Judgment normal.     ED Results / Procedures / Treatments   Labs (all labs ordered are listed, but only abnormal results are displayed) Labs Reviewed  BASIC METABOLIC PANEL - Abnormal; Notable for the following components:      Result Value   Glucose, Bld 115 (*)    BUN 5 (*)    All other components within normal limits  CBC WITH DIFFERENTIAL/PLATELET - Abnormal; Notable for the following components:    Hemoglobin 10.7 (*)    MCV 79.3 (*)    MCH 23.6 (*)    MCHC 29.7 (*)    RDW 19.9 (*)    Platelets 438 (*)    All other components within normal limits  RESP PANEL BY RT-PCR (FLU A&B, COVID) [id number]  I-STAT BETA HCG BLOOD, ED ([facility], [facility], [facility] ONLY)    EKG EKG Interpretation  Date/Time:  Wednesday January 10 2021 19:23:02 EDT Ventricular Rate:  109 PR Interval:  134 QRS Duration: 68 QT Interval:  328 QTC Calculation: 442 R Axis:   122 Text Interpretation: Sinus tachycardia Right axis deviation No prior ECG for comparison. No STEMI Confirmed by Theda Belfast ([id number]) on 01/10/2021 7:34:48 PM   Radiology DG Chest Port 1 View  Result Date: 01/10/2021 CLINICAL DATA:  Shortness of breath for 2 hours EXAM: PORTABLE CHEST 1 VIEW COMPARISON:  None. FINDINGS: The heart size and mediastinal contours are within normal limits. Both lungs are clear. The visualized skeletal structures are unremarkable. IMPRESSION: No active disease. Electronically Signed   By: Alcide Clever M.D.   On: 01/10/2021 20:27    Procedures Procedures   Medications Ordered in ED Medications  albuterol (PROVENTIL,VENTOLIN) solution continuous neb (10 mg/hr Nebulization Given 01/10/21 2103)  ipratropium (ATROVENT) nebulizer solution 0.5 mg (0.5 mg Nebulization Given 01/10/21 2103)    ED Course  I have reviewed the triage vital signs and the nursing notes.  Pertinent labs & imaging results that were available during my care of the patient were reviewed by me and considered in my medical decision making (see chart for details).    MDM Rules/Calculators/A&P                          45 year old female presents to the ED for shortness of breath, productive cough, wheezing.  Feels like previous asthma flares.  Has been cutting back on cigarette use.  Symptoms refractory to home albuterol nebulizer and inhaler.  EMR, triage nursing notes reviewed  Additional information obtained from EMS.  Wheezing has improved after breathing  treatments, Solu-Medrol on route.    Highest on DDx is asthma exacerbation, bronchitis.  Probably has some underlying COPD.  As she has had symptoms for 1 week without fever, chills.    Pneumonia less likely doubt PE, ACS in the setting.  Lab work, imaging ordered by me as above.  Lab work, imaging personally visualized and interpreted  Lab work reveals stable anemia.  No leukocytosis.  Respiratory panel is negative.  Imaging reveals -chest x-ray without acute findings.  EKG shows sinus tachycardia.   Medicines ordered-continues albuterol nebulizing treatment, Atrovent, tessalon perles.   Patient reevaluated after treatment.  Reports "100%" improvement in her symptoms.  Repeat auscultation reveals significant improvement in wheezing.  Tachypnea has resolved.  Patient appropriate for discharge.  Will treat for asthma exacerbation/bronchitis in setting of tobacco use will prescribe prednisone taper, Tessalon Perles, Delsym, doxycycline.  Also recommended Flonase, antihistamine.  Return precautions discussed.  Patient is comfortable with this plan. Final Clinical Impression(s) / ED Diagnoses Final diagnoses:  Moderate persistent asthma with exacerbation  Bronchitis    Rx / DC Orders ED Discharge Orders         Ordered    doxycycline (VIBRAMYCIN) 100 MG capsule  2 times daily        01/10/21 2244    benzonatate (TESSALON PERLES) 100 MG capsule  3 times daily        01/10/21 2244    predniSONE (STERAPRED UNI-PAK 21 TAB) 10 MG (21) TBPK tablet        01/10/21 2244    albuterol (VENTOLIN HFA) 108 (90 Base) MCG/ACT inhaler  Every 6 hours PRN        01/10/21 2244           Liberty HandyGibbons, Tyreonna Czaplicki J, PA-C 01/10/21 2245    Tegeler, Canary Brimhristopher J, MD 01/11/21 [phone]

## 2021-01-10 NOTE — ED Notes (Signed)
Requested respiratory give contin. Neb.

## 2021-03-30 ENCOUNTER — Other Ambulatory Visit: Payer: Self-pay | Admitting: Family Medicine

## 2021-03-30 DIAGNOSIS — Z1231 Encounter for screening mammogram for malignant neoplasm of breast: Secondary | ICD-10-CM

## 2021-04-18 ENCOUNTER — Encounter (HOSPITAL_COMMUNITY): Payer: Self-pay

## 2021-04-18 ENCOUNTER — Emergency Department (HOSPITAL_COMMUNITY)
Admission: EM | Admit: 2021-04-18 | Discharge: 2021-04-18 | Disposition: A | Discharge status: Home / Self Care | Payer: Medicaid Other | Attending: Emergency Medicine | Admitting: Emergency Medicine

## 2021-04-18 ENCOUNTER — Other Ambulatory Visit: Payer: Self-pay

## 2021-04-18 DIAGNOSIS — Z8616 Personal history of COVID-19: Secondary | ICD-10-CM | POA: Insufficient documentation

## 2021-04-18 DIAGNOSIS — F17210 Nicotine dependence, cigarettes, uncomplicated: Secondary | ICD-10-CM | POA: Diagnosis not present

## 2021-04-18 DIAGNOSIS — J9801 Acute bronchospasm: Secondary | ICD-10-CM | POA: Insufficient documentation

## 2021-04-18 DIAGNOSIS — R0602 Shortness of breath: Secondary | ICD-10-CM | POA: Diagnosis present

## 2021-04-18 MED ORDER — PREDNISONE 10 MG PO TABS: ORAL_TABLET | ORAL | 0 refills | Status: DC

## 2021-04-18 MED ORDER — ALBUTEROL SULFATE HFA 108 (90 BASE) MCG/ACT IN AERS
2.0000 | INHALATION_SPRAY | RESPIRATORY_TRACT | Status: DC | PRN
  Administered 2021-04-18: 2 via RESPIRATORY_TRACT
  Filled 2021-04-18: qty 6.7

## 2021-04-18 NOTE — ED Triage Notes (Signed)
Patient BIB [facility] from home. Patient has asthma and took 7 duonebs without relief feeling short of breath all day. Had Covid in May.  EMS Gave 125mg  solumedrol 1 duoneb 5 albuterol 0.5 atrovent Room air sats 94 110/70 HR 120

## 2021-04-18 NOTE — ED Provider Notes (Signed)
[facility]-EMERGENCY DEPT Provider Note: Lowella Dell, MD, FACEP  CSN: [id number] MRN: [id number] ARRIVAL: 04/18/21 at 0310 ROOM: WA10/WA10   CHIEF COMPLAINT  Shortness of Breath   HISTORY OF PRESENT ILLNESS  04/18/21 3:26 AM Amber Clarke is a 45 y.o. female with a history of asthma.  She had COVID in May of this year.  She called EMS after having an exacerbation of asthma since yesterday.  She had taken 7 duo nebs throughout the day without adequate relief.  EMS administered albuterol and ipratropium by neb prior to arrival and also administered 125 mg of Solu-Medrol IV.  She states her shortness of breath was severe when she was at home and was worse with attempts to ambulate or get things done.  She has not had a fever or any GI symptoms.  She has had a dry cough with this.  She attributes this attack to the weather.   Past Medical History:  Diagnosis Date   Asthma    Seizures (HCC)     Past Surgical History:  Procedure Laterality Date   CHOLECYSTECTOMY     TUBAL LIGATION      Family History  Problem Relation Age of Onset   Hypertension Mother    Diabetes Mother    COPD Mother    Hypertension Father    Heart disease Sister    Heart disease Maternal Grandmother     Social History   Tobacco Use   Smoking status: Every Day    Pack years: 0.00    Types: Cigarettes   Smokeless tobacco: Never  Substance Use Topics   Alcohol use: Not Currently   Drug use: Not Currently    Prior to Admission medications   Medication Sig Start Date End Date Taking? Authorizing Provider  predniSONE (DELTASONE) 10 MG tablet Take 2 tablets (40 mg) daily for 4 days starting 04/19/2021. 04/18/21  Yes Leandria Thier, MD  albuterol (PROVENTIL HFA) 108 (90 Base) MCG/ACT inhaler Inhale 2 puffs into the lungs every 6 (six) hours as needed for wheezing or shortness of breath. 12/28/19   Hoy Register, MD  diclofenac Sodium (VOLTAREN) 1 % GEL Apply 2-4 g topically daily. 12/21/20   [provider]  famotidine (PEPCID) 20 MG tablet Take 20 mg by mouth 2 (two) times daily. 12/26/20   [provider]  gabapentin (NEURONTIN) 300 MG capsule Take 300 mg by mouth 2 (two) times daily. 12/26/20   [provider]  HYDROcodone-acetaminophen (NORCO/VICODIN) 5-325 MG tablet Take 1 tablet by mouth 2 (two) times daily. 12/21/20   [provider]  ipratropium-albuterol (DUONEB) 0.5-2.5 (3) MG/3ML SOLN Take 3 mLs by nebulization 4 (four) times daily. 12/26/20   [provider]  levETIRAcetam (KEPPRA) 750 MG tablet Take 1 tablet (750 mg total) by mouth 2 (two) times daily. 12/28/19   Hoy Register, MD  meloxicam (MOBIC) 7.5 MG tablet TAKE 1 TABLET (7.5 MG TOTAL) BY MOUTH DAILY. 08/23/20   Hoy Register, MD  montelukast (SINGULAIR) 10 MG tablet Take 1 tablet (10 mg total) by mouth at bedtime. Patient taking differently: Take 10 mg by mouth at bedtime as needed (allergies). 12/28/19   Hoy Register, MD  omeprazole (PRILOSEC) 20 MG capsule Take 20 mg by mouth daily. 12/26/20   [provider]  tiZANidine (ZANAFLEX) 4 MG tablet TAKE 1 TABLET (4 MG TOTAL) BY MOUTH EVERY 6 (SIX) HOURS AS NEEDED FOR MUSCLE SPASMS. 04/20/20   Hoy Register, MD    Allergies Keflex [cephalexin] and Pollen extract  REVIEW OF SYSTEMS  Negative except as noted here or in the History of Present Illness.   PHYSICAL EXAMINATION  Initial Vital Signs Blood pressure 132/63, pulse (!) 111, temperature 98.7 F (37.1 C), temperature source Oral, resp. rate 19, height 4\' 11"  (1.499 m), weight 83 kg, SpO2 99 %.  Examination General: Well-developed, well-nourished female in no acute distress; appearance consistent with age of record HENT: normocephalic; atraumatic Eyes: Normal appearance Neck: supple Heart: regular rate and rhythm; tachycardia Lungs: clear to auscultation bilaterally; frequent cough Abdomen: soft; nondistended; nontender; no masses or hepatosplenomegaly; bowel sounds  present Extremities: No deformity; full range of motion; pulses normal Neurologic: Awake, alert and oriented; motor function intact in all extremities and symmetric; no facial droop Skin: Warm and dry Psychiatric: Normal mood and affect   RESULTS  Summary of this visit's results, reviewed and interpreted by myself:   EKG Interpretation  Date/Time:    Ventricular Rate:    PR Interval:    QRS Duration:   QT Interval:    QTC Calculation:   R Axis:     Text Interpretation:          Laboratory Studies: No results found for this or any previous visit (from the past 24 hour(s)). Imaging Studies: No results found.  ED COURSE and MDM  Nursing notes, initial and subsequent vitals signs, including pulse oximetry, reviewed and interpreted by myself.  Vitals:   04/18/21 0325 04/18/21 0330 04/18/21 0415 04/18/21 0435  BP: 120/73 (!) 118/54  107/77  Pulse: (!) 115 (!) 123 (!) 113 (!) 109  Resp: (!) 26 (!) 24 20 (!) 22  Temp:      TempSrc:      SpO2: 99% 100% 92% 90%  Weight:      Height:       Medications  albuterol (VENTOLIN HFA) 108 (90 Base) MCG/ACT inhaler 2 puff (has no administration in time range)   5:17 AM Patient resting comfortably without tachypnea or increased work of breathing.  Patient has only faint expiratory wheezes.  She feels much improved and ready to go home.  Her oxygen saturation was reading 88% but when the pulse oximeter was repositioned on her finger it came up to 95% on room air.  Her inhaler is out and we will refill it.   PROCEDURES  Procedures   ED DIAGNOSES     ICD-10-CM   1. Acute bronchospasm  J98.01          Hilma Steinhilber, 06/19/21, MD 04/18/21 [id number]

## 2021-05-24 ENCOUNTER — Ambulatory Visit: Payer: Medicaid Other

## 2021-05-31 ENCOUNTER — Ambulatory Visit: Payer: Medicaid Other | Attending: Physical Medicine and Rehabilitation

## 2021-10-10 ENCOUNTER — Emergency Department (HOSPITAL_COMMUNITY)
Admission: EM | Admit: 2021-10-10 | Discharge: 2021-10-10 | Discharge status: Against Medical Advice | Payer: Medicaid Other

## 2021-10-10 NOTE — ED Notes (Signed)
Called 3x for triage, no answer 

## 2021-12-14 ENCOUNTER — Encounter: Payer: Self-pay | Admitting: Student

## 2021-12-14 ENCOUNTER — Other Ambulatory Visit: Payer: Self-pay

## 2021-12-14 ENCOUNTER — Ambulatory Visit (INDEPENDENT_AMBULATORY_CARE_PROVIDER_SITE_OTHER): Payer: Medicaid Other

## 2021-12-14 ENCOUNTER — Ambulatory Visit (INDEPENDENT_AMBULATORY_CARE_PROVIDER_SITE_OTHER): Payer: Medicaid Other | Admitting: Student

## 2021-12-14 ENCOUNTER — Encounter: Payer: Self-pay | Admitting: *Deleted

## 2021-12-14 VITALS — BP 102/60 | HR 115 | Temp 98.4°F | Ht 59.0 in | Wt 178.8 lb

## 2021-12-14 DIAGNOSIS — J4540 Moderate persistent asthma, uncomplicated: Secondary | ICD-10-CM

## 2021-12-14 DIAGNOSIS — R053 Chronic cough: Secondary | ICD-10-CM

## 2021-12-14 MED ORDER — NYSTATIN 100000 UNIT/ML MT SUSP
5.0000 mL | Freq: Four times a day (QID) | OROMUCOSAL | 0 refills | Status: AC
Start: 2021-12-14 — End: 2021-12-28

## 2021-12-14 MED ORDER — SPACER/AERO-HOLDING CHAMBERS DEVI
1.0000 | Freq: Two times a day (BID) | 0 refills | Status: AC
Start: 2021-12-14 — End: ?

## 2021-12-14 MED ORDER — PREDNISONE 2.5 MG PO TABS
ORAL_TABLET | ORAL | 0 refills | Status: DC
Start: 2021-12-14 — End: 2022-02-06

## 2021-12-14 NOTE — Patient Instructions (Addendum)
-   Steroid taper see instructions ?- Breztri 2 puffs twice daily with spacer rinse mouth and spacer after use ?- neti pot rinse with distilled water or boiled then cooled water and salt pack - see instructions. This is for sinus congestion, crusting, and post-nasal drainage ?- try using flonase after this 1 spray each nostril ?- can add zyrtec or claritin daily over the counter if allergy real bad ?- x ray and labs today ? ? ?

## 2021-12-14 NOTE — Progress Notes (Signed)
? ?Synopsis: Referred for asthma by Jackie Plum, MD ? ?Subjective:  ? ?PATIENT ID: Amber Clarke GENDER: female DOB: Aug 23, 1976, MRN: [id number] ? ?Chief Complaint  ?Patient presents with  ? Pulmonary Consult  ?  Referred by Dr. Greggory Stallion Osei-Bonsu. Pt states she was dx with Asthma as a child and for the past 3 years it has been hard to control. She also c/o cough and wheezing. Cough is currently prod with green/yellow sputum.   ? ?45yF with history of asthma, seizures. Had covid in 02/2021 wasn't severe. She is not vaccinated for covid-19. ? ?She has been on breo since December taking 1 puff twice daily, not rinsing mouth afterward.  ? ?She has a very bad cough. Productive cough. Has been bad since Nov of last year. She does have sinus congestion, nasal drainage. She is on singulair has been on it for a while. She says it burns when she tried using flonase in past. SHe takes omeprazole before dinner.  ? ?She has DOE to half block maybe. Gradually worsening but breo has helped some. ? ?Inhalers tried: ?Albuterol ?Advair diskus no problems ?Dulera made her vision blurry ?Virgel Bouquet likes it ? ? ?Otherwise pertinent review of systems is negative. ? ?COPD in mother and father.  ? ?She is from [place]. Moved to [place] 3 ya. Smokes half ppd. 2 blunts MJ. No vaping. No pets.  ? ?Past Medical History:  ?Diagnosis Date  ? Asthma   ? Seizures (HCC)   ?  ? ?Family History  ?Problem Relation Age of Onset  ? Hypertension Mother   ? Diabetes Mother   ? COPD Mother   ? Asthma Mother   ? Hypertension Father   ? Asthma Father   ? Heart disease Sister   ? Heart disease Maternal Grandmother   ?  ? ?Past Surgical History:  ?Procedure Laterality Date  ? CHOLECYSTECTOMY    ? TUBAL LIGATION    ? ? ?Social History  ? ?Socioeconomic History  ? Marital status: Single  ?  Spouse name: Not on file  ? Number of children: Not on file  ? Years of education: Not on file  ? Highest education level: Not on file  ?Occupational History  ? Not on file   ?Tobacco Use  ? Smoking status: Every Day  ?  Packs/day: 0.50  ?  Years: 28.00  ?  Pack years: 14.00  ?  Types: Cigarettes  ? Smokeless tobacco: Never  ?Vaping Use  ? Vaping Use: Never used  ?Substance and Sexual Activity  ? Alcohol use: Not Currently  ? Drug use: Not Currently  ? Sexual activity: Yes  ?  Birth control/protection: None  ?Other Topics Concern  ? Not on file  ?Social History Narrative  ? Not on file  ? ?Social Determinants of Health  ? ?Financial Resource Strain: Not on file  ?Food Insecurity: Not on file  ?Transportation Needs: Not on file  ?Physical Activity: Not on file  ?Stress: Not on file  ?Social Connections: Not on file  ?Intimate Partner Violence: Not on file  ?  ? ?Allergies  ?Allergen Reactions  ? Keflex [Cephalexin]   ? Pollen Extract   ?  ? ?Outpatient Medications Prior to Visit  ?Medication Sig Dispense Refill  ? albuterol (PROVENTIL HFA) 108 (90 Base) MCG/ACT inhaler Inhale 2 puffs into the lungs every 6 (six) hours as needed for wheezing or shortness of breath. 18 g 6  ? diclofenac Sodium (VOLTAREN) 1 % GEL Apply 2-4 g topically daily.    ?  famotidine (PEPCID) 20 MG tablet Take 20 mg by mouth daily.    ? gabapentin (NEURONTIN) 300 MG capsule Take 300 mg by mouth 2 (two) times daily as needed.    ? ipratropium-albuterol (DUONEB) 0.5-2.5 (3) MG/3ML SOLN Take 3 mLs by nebulization 4 (four) times daily.    ? levETIRAcetam (KEPPRA) 750 MG tablet Take 1 tablet (750 mg total) by mouth 2 (two) times daily. 60 tablet 6  ? meloxicam (MOBIC) 7.5 MG tablet TAKE 1 TABLET (7.5 MG TOTAL) BY MOUTH DAILY. 30 tablet 0  ? montelukast (SINGULAIR) 10 MG tablet Take 1 tablet (10 mg total) by mouth at bedtime. (Patient taking differently: Take 10 mg by mouth at bedtime as needed (allergies).) 30 tablet 6  ? omeprazole (PRILOSEC) 20 MG capsule Take 20 mg by mouth daily.    ? predniSONE (DELTASONE) 20 MG tablet Take 20 mg by mouth daily with breakfast.    ? tiZANidine (ZANAFLEX) 4 MG tablet TAKE 1 TABLET (4  MG TOTAL) BY MOUTH EVERY 6 (SIX) HOURS AS NEEDED FOR MUSCLE SPASMS. 60 tablet 2  ? HYDROcodone-acetaminophen (NORCO/VICODIN) 5-325 MG tablet Take 1 tablet by mouth 2 (two) times daily.    ? predniSONE (DELTASONE) 10 MG tablet Take 2 tablets (40 mg) daily for 4 days starting 04/19/2021. 10 tablet 0  ? ?No facility-administered medications prior to visit.  ? ? ? ? ? ?Objective:  ? ?Physical Exam: ? ?General appearance: 46 y.o., female, NAD, conversant  ?Eyes: anicteric sclerae; PERRL, tracking appropriately ?HENT: NCAT; MMM ?Neck: Trachea midline; no lymphadenopathy, no JVD ?Lungs: faint wheeze right upper lung field, with normal respiratory effort ?CV: RRR, no murmur  ?Abdomen: Soft, non-tender; non-distended, BS present  ?Extremities: No peripheral edema, warm ?Skin: Normal turgor and texture; no rash ?Psych: Appropriate affect ?Neuro: Alert and oriented to person and place, no focal deficit  ? ? ? ?Vitals:  ? 12/14/21 1449  ?BP: 102/60  ?Pulse: (!) 115  ?Temp: 98.4 ?F (36.9 ?C)  ?TempSrc: Oral  ?SpO2: 96%  ?Weight: 178 lb 12.8 oz (81.1 kg)  ?Height: 4\' 11"  (1.499 m)  ? ?96% on RA ?BMI Readings from Last 3 Encounters:  ?12/14/21 36.11 kg/m?  ?04/18/21 36.96 kg/m?  ?01/10/21 36.96 kg/m?  ? ?Wt Readings from Last 3 Encounters:  ?12/14/21 178 lb 12.8 oz (81.1 kg)  ?04/18/21 183 lb (83 kg)  ?01/10/21 182 lb 15.7 oz (83 kg)  ? ? ? ?CBC ?   ?Component Value Date/Time  ? WBC 7.9 01/10/2021 2025  ? RBC 4.54 01/10/2021 2025  ? HGB 10.7 (L) 01/10/2021 2025  ? HCT 36.0 01/10/2021 2025  ? PLT 438 (H) 01/10/2021 2025  ? MCV 79.3 (L) 01/10/2021 2025  ? MCH 23.6 (L) 01/10/2021 2025  ? MCHC 29.7 (L) 01/10/2021 2025  ? RDW 19.9 (H) 01/10/2021 2025  ? LYMPHSABS 1.6 01/10/2021 2025  ? MONOABS 0.3 01/10/2021 2025  ? EOSABS 0.2 01/10/2021 2025  ? BASOSABS 0.1 01/10/2021 2025  ? ? ?Eos 200 ? ?Chest Imaging: ?CXR reviewed by me accounting for difference in penetration not significantly changed ? ?Pulmonary Functions Testing Results: ?No  flowsheet data found. ? ?   ?Assessment & Plan:  ? ?# OCS dependent severe persistent asthma:  ? ?# Smoking ? ?Plan: ?- attempt to taper off prednisone 5 week taper prescribed ?- start breztri 2 puffs twice daily with spacer rinse mouth and spacer after use ?- nystatin swish for 2 weeks ?- neti pot rinse with distilled water or boiled then  cooled water and salt pack - see instructions. This is for sinus congestion, crusting, and post-nasal drainage ?- try using flonase after this 1 spray each nostril ?- can add zyrtec or claritin daily over the counter if allergy real bad ?- PFTs once we get her off of steroids ideally  ?- smoking cessation strongly encouraged ? ?RTC 6 weeks  ? ? ?Omar Person, MD ?[facility] ?12/14/2021 2:58 PM  ? ?

## 2022-01-31 ENCOUNTER — Other Ambulatory Visit: Payer: Self-pay | Admitting: Internal Medicine

## 2022-01-31 DIAGNOSIS — Z1231 Encounter for screening mammogram for malignant neoplasm of breast: Secondary | ICD-10-CM

## 2022-02-05 NOTE — Progress Notes (Signed)
? ?Synopsis: Referred for asthma by Hoy Register, MD ? ?Subjective:  ? ?PATIENT ID: Amber Clarke GENDER: female DOB: 20-Oct-1975, MRN: [id number] ? ?Chief Complaint  ?Patient presents with  ? Follow-up  ?  PFT's done today.  Cough has been worse over the past 2 wks- prod with thick, yellow sputum. Coughing sometimes wakes her up in the night. She has had some HA and facial pressure. She is using her albuterol several times per day.   ? ?45yF with history of asthma, seizures. Had covid in 02/2021 wasn't severe. She is not vaccinated for covid-19. ? ?She has been on breo since December taking 1 puff twice daily, not rinsing mouth afterward.  ? ?She has a very bad cough. Productive cough. Has been bad since Nov of last year. She does have sinus congestion, nasal drainage. She is on singulair has been on it for a while. She says it burns when she tried using flonase in past. SHe takes omeprazole before dinner.  ? ?She has DOE to half block maybe. Gradually worsening but breo has helped some. ? ?Inhalers tried: ?Albuterol ?Advair diskus no problems ?Dulera made her vision blurry ?Virgel Bouquet likes it ? ?COPD in mother and father.  ? ?She is from [place]. Moved to [place] 3 ya. Smokes half ppd. 2 blunts MJ. No vaping. No pets.  ? ?Interval HPI: ?Exacerbation 01/29/22 requiring course of steroid. At last visit was provided prednisone taper and breztri with spacer.  ? ?She has been using her breztri with spacer, rinsing mouth after use.  ? ?Worse DOE and cough than at last visit but still recovering from this recent exacerbation.  ? ?Otherwise pertinent review of systems is negative. ? ? ? ?Past Medical History:  ?Diagnosis Date  ? Asthma   ? Seizures (HCC)   ?  ? ?Family History  ?Problem Relation Age of Onset  ? Hypertension Mother   ? Diabetes Mother   ? COPD Mother   ? Asthma Mother   ? Hypertension Father   ? Asthma Father   ? Heart disease Sister   ? Heart disease Maternal Grandmother   ?  ? ?Past Surgical History:  ?Procedure  Laterality Date  ? CHOLECYSTECTOMY    ? TUBAL LIGATION    ? ? ?Social History  ? ?Socioeconomic History  ? Marital status: Single  ?  Spouse name: Not on file  ? Number of children: Not on file  ? Years of education: Not on file  ? Highest education level: Not on file  ?Occupational History  ? Not on file  ?Tobacco Use  ? Smoking status: Every Day  ?  Packs/day: 0.50  ?  Years: 28.00  ?  Pack years: 14.00  ?  Types: Cigarettes  ? Smokeless tobacco: Never  ?Vaping Use  ? Vaping Use: Never used  ?Substance and Sexual Activity  ? Alcohol use: Not Currently  ? Drug use: Not Currently  ? Sexual activity: Yes  ?  Birth control/protection: None  ?Other Topics Concern  ? Not on file  ?Social History Narrative  ? Not on file  ? ?Social Determinants of Health  ? ?Financial Resource Strain: Not on file  ?Food Insecurity: Not on file  ?Transportation Needs: Not on file  ?Physical Activity: Not on file  ?Stress: Not on file  ?Social Connections: Not on file  ?Intimate Partner Violence: Not on file  ?  ? ?Allergies  ?Allergen Reactions  ? Keflex [Cephalexin]   ? Pollen Extract   ?  ? ?  Outpatient Medications Prior to Visit  ?Medication Sig Dispense Refill  ? albuterol (PROVENTIL HFA) 108 (90 Base) MCG/ACT inhaler Inhale 2 puffs into the lungs every 6 (six) hours as needed for wheezing or shortness of breath. 18 g 6  ? diclofenac Sodium (VOLTAREN) 1 % GEL Apply 2-4 g topically daily.    ? famotidine (PEPCID) 20 MG tablet Take 20 mg by mouth daily.    ? gabapentin (NEURONTIN) 300 MG capsule Take 300 mg by mouth 2 (two) times daily as needed.    ? ipratropium-albuterol (DUONEB) 0.5-2.5 (3) MG/3ML SOLN Take 3 mLs by nebulization 4 (four) times daily.    ? levETIRAcetam (KEPPRA) 750 MG tablet Take 1 tablet (750 mg total) by mouth 2 (two) times daily. 60 tablet 6  ? meloxicam (MOBIC) 7.5 MG tablet TAKE 1 TABLET (7.5 MG TOTAL) BY MOUTH DAILY. 30 tablet 0  ? montelukast (SINGULAIR) 10 MG tablet Take 1 tablet (10 mg total) by mouth at  bedtime. (Patient taking differently: Take 10 mg by mouth at bedtime as needed (allergies).) 30 tablet 6  ? omeprazole (PRILOSEC) 20 MG capsule Take 20 mg by mouth daily.    ? Spacer/Aero-Holding Chambers DEVI 1 each by Does not apply route in the morning and at bedtime. Please use with MDI inhalers 1 each 0  ? tiZANidine (ZANAFLEX) 4 MG tablet TAKE 1 TABLET (4 MG TOTAL) BY MOUTH EVERY 6 (SIX) HOURS AS NEEDED FOR MUSCLE SPASMS. 60 tablet 2  ? predniSONE (DELTASONE) 2.5 MG tablet Take 4 tablets daily for 1 week, 3 tablets daily for 1 week, 2 tablets daily for 1 week, 1 tablet daily for 2 weeks 77 tablet 0  ? ?No facility-administered medications prior to visit.  ? ? ? ? ? ?Objective:  ? ?Physical Exam: ? ?General appearance: 46 y.o., female, NAD, conversant  ?Eyes: anicteric sclerae; PERRL, tracking appropriately ?HENT: NCAT; MMM, hoarse ?Neck: Trachea midline; no lymphadenopathy, no JVD ?Lungs: faint wheeze right lower lung field, with normal respiratory effort ?CV: RRR, no murmur  ?Abdomen: Soft, non-tender; non-distended, BS present  ?Extremities: No peripheral edema, warm ?Skin: Normal turgor and texture; no rash ?Psych: Appropriate affect ?Neuro: Alert and oriented to person and place, no focal deficit  ? ? ? ?Vitals:  ? 02/06/22 1333  ?BP: 108/64  ?Pulse: (!) 122  ?Temp: 98 ?F (36.7 ?C)  ?TempSrc: Oral  ?SpO2: 96%  ?Weight: 180 lb 9.6 oz (81.9 kg)  ?Height: 5' (1.524 m)  ? ? ?96% on RA ?BMI Readings from Last 3 Encounters:  ?02/06/22 35.27 kg/m?  ?12/14/21 36.11 kg/m?  ?04/18/21 36.96 kg/m?  ? ?Wt Readings from Last 3 Encounters:  ?02/06/22 180 lb 9.6 oz (81.9 kg)  ?12/14/21 178 lb 12.8 oz (81.1 kg)  ?04/18/21 183 lb (83 kg)  ? ? ? ?CBC ?   ?Component Value Date/Time  ? WBC 7.9 01/10/2021 2025  ? RBC 4.54 01/10/2021 2025  ? HGB 10.7 (L) 01/10/2021 2025  ? HCT 36.0 01/10/2021 2025  ? PLT 438 (H) 01/10/2021 2025  ? MCV 79.3 (L) 01/10/2021 2025  ? MCH 23.6 (L) 01/10/2021 2025  ? MCHC 29.7 (L) 01/10/2021 2025  ?  RDW 19.9 (H) 01/10/2021 2025  ? LYMPHSABS 1.6 01/10/2021 2025  ? MONOABS 0.3 01/10/2021 2025  ? EOSABS 0.2 01/10/2021 2025  ? BASOSABS 0.1 01/10/2021 2025  ? ? ?Eos 200 ? ?Chest Imaging: ?CXR 12/16/21 reviewed by me accounting for difference in penetration not significantly changed ? ?Pulmonary Functions Testing Results: ? ?  Latest Ref Rng & Units 02/06/2022  ? 11:55 AM  ?PFT Results  ?FVC-Pre L 1.88  P  ?FVC-Predicted Pre % 73  P  ?FVC-Post L 1.91  P  ?FVC-Predicted Post % 74  P  ?Pre FEV1/FVC % % 48  P  ?Post FEV1/FCV % % 49  P  ?FEV1-Pre L 0.90  P  ?FEV1-Predicted Pre % 42  P  ?FEV1-Post L 0.93  P  ?DLCO uncorrected ml/min/mmHg 6.03  P  ?DLCO UNC% % 32  P  ?DLCO corrected ml/min/mmHg 6.03  P  ?DLCO COR %Predicted % 32  P  ?DLVA Predicted % 37  P  ?TLC L 4.36  P  ?TLC % Predicted % 97  P  ?RV % Predicted % 156  P  ?  ?P Preliminary result  ?Severe obstruction, air trapping, severely reduced diffusing capacity ? ?   ?Assessment & Plan:  ? ?# OCS dependent severe persistent asthma/COPD overlap in exacerbation ? ?# AR ? ?# Excessive daytime sleepiness ?# Snoring ? ?# Smoking ?Smoking cessation instruction/counseling given:  counseled patient on the dangers of tobacco use, advised patient to stop smoking, and reviewed strategies to maximize success  ? ?Plan: ?- finish prednisone taper ?- continue breztri 2 puffs twice daily with spacer rinse mouth and spacer after use ?- fluconazole for empiric tx of thrush failing to respond to nystatin ?- try using flonase after this 1 spray each nostril ?- neti pot rinse with distilled water or boiled then cooled water and salt pack - see instructions. This is for sinus congestion, crusting, and post-nasal drainage ?- chantix prescribed ?- could have emphysema with longstanding smoking but HRCT Chest ordered given severity of diffusion impairment and to evaluate for asthma mimics like ABPA, sarcoid ?- cbc/diff and IgE next visit  ?- can add zyrtec or claritin daily over the counter  if allergy real bad ? ?RTC 6 weeks  ? ? ?Omar Person, MD ?[facility] ?02/06/2022 1:56 PM  ? ?

## 2022-02-06 ENCOUNTER — Ambulatory Visit (INDEPENDENT_AMBULATORY_CARE_PROVIDER_SITE_OTHER): Payer: Medicaid Other | Admitting: Student

## 2022-02-06 ENCOUNTER — Encounter: Payer: Self-pay | Admitting: Student

## 2022-02-06 VITALS — BP 108/64 | HR 122 | Temp 98.0°F | Ht 60.0 in | Wt 180.6 lb

## 2022-02-06 DIAGNOSIS — J45901 Unspecified asthma with (acute) exacerbation: Secondary | ICD-10-CM

## 2022-02-06 DIAGNOSIS — J441 Chronic obstructive pulmonary disease with (acute) exacerbation: Secondary | ICD-10-CM

## 2022-02-06 DIAGNOSIS — J4540 Moderate persistent asthma, uncomplicated: Secondary | ICD-10-CM

## 2022-02-06 DIAGNOSIS — G4719 Other hypersomnia: Secondary | ICD-10-CM | POA: Diagnosis not present

## 2022-02-06 DIAGNOSIS — F17200 Nicotine dependence, unspecified, uncomplicated: Secondary | ICD-10-CM

## 2022-02-06 DIAGNOSIS — R942 Abnormal results of pulmonary function studies: Secondary | ICD-10-CM

## 2022-02-06 LAB — PULMONARY FUNCTION TEST
DL/VA % pred: 37 %
DL/VA: 1.7 ml/min/mmHg/L
DLCO cor % pred: 32 %
DLCO cor: 6.03 ml/min/mmHg
DLCO unc % pred: 32 %
DLCO unc: 6.03 ml/min/mmHg
FEF 25-75 Post: 0.39 L/sec
FEF 25-75 Pre: 0.35 L/sec
FEF2575-%Change-Post: 11 %
FEF2575-%Pred-Post: 16 %
FEF2575-%Pred-Pre: 14 %
FEV1-%Change-Post: 3 %
FEV1-%Pred-Post: 44 %
FEV1-%Pred-Pre: 42 %
FEV1-Post: 0.93 L
FEV1-Pre: 0.9 L
FEV1FVC-%Change-Post: 1 %
FEV1FVC-%Pred-Pre: 57 %
FEV6-%Change-Post: 2 %
FEV6-%Pred-Post: 74 %
FEV6-%Pred-Pre: 72 %
FEV6-Post: 1.86 L
FEV6-Pre: 1.8 L
FEV6FVC-%Change-Post: 0 %
FEV6FVC-%Pred-Post: 99 %
FEV6FVC-%Pred-Pre: 98 %
FVC-%Change-Post: 1 %
FVC-%Pred-Post: 74 %
FVC-%Pred-Pre: 73 %
FVC-Post: 1.91 L
FVC-Pre: 1.88 L
Post FEV1/FVC ratio: 49 %
Post FEV6/FVC ratio: 97 %
Pre FEV1/FVC ratio: 48 %
Pre FEV6/FVC Ratio: 96 %
RV % pred: 156 %
RV: 2.33 L
TLC % pred: 97 %
TLC: 4.36 L

## 2022-02-06 MED ORDER — VARENICLINE TARTRATE 1 MG PO TABS: 1.0000 mg | ORAL_TABLET | Freq: Two times a day (BID) | ORAL | 1 refills | Status: DC

## 2022-02-06 MED ORDER — FLUCONAZOLE 100 MG PO TABS: 200.0000 mg | ORAL_TABLET | Freq: Every day | ORAL | 0 refills | Status: AC

## 2022-02-06 MED ORDER — ALBUTEROL SULFATE (2.5 MG/3ML) 0.083% IN NEBU: 2.5000 mg | INHALATION_SOLUTION | Freq: Four times a day (QID) | RESPIRATORY_TRACT | 12 refills | Status: AC | PRN

## 2022-02-06 MED ORDER — VARENICLINE TARTRATE 0.5 MG X 11 & 1 MG X 42 PO TBPK: ORAL_TABLET | ORAL | 0 refills | Status: DC

## 2022-02-06 MED ORDER — FLUCONAZOLE 100 MG PO TABS: 100.0000 mg | ORAL_TABLET | Freq: Every day | ORAL | 0 refills | Status: DC

## 2022-02-06 MED ORDER — BREZTRI AEROSPHERE 160-9-4.8 MCG/ACT IN AERO: 2.0000 | INHALATION_SPRAY | Freq: Two times a day (BID) | RESPIRATORY_TRACT | 11 refills | Status: DC

## 2022-02-06 MED ORDER — FLUTICASONE PROPIONATE 50 MCG/ACT NA SUSP: 1.0000 | Freq: Every day | NASAL | 11 refills | Status: DC

## 2022-02-06 NOTE — Progress Notes (Signed)
PFT done today. 

## 2022-02-06 NOTE — Patient Instructions (Addendum)
-   you will be called to schedule ct of your chest ?- fluconazole 2 tablets daily for 10 days ?- chantix - see instructions to gradually increase dose and ideally stop smoking a week after starting chantix ?- Breztri 2 puffs twice daily with spacer rinse mouth and spacer after use ?- start flonase 1 spray each nostril after clearing your nose out of crusting following a shower ?- neti pot rinse with distilled water or boiled then cooled water and salt pack - see instructions. This is for sinus congestion, crusting, and post-nasal drainage ?- can add zyrtec or claritin daily over the counter if allergy real bad ?- STOP SMOKING!!! ? ? ?

## 2022-02-07 ENCOUNTER — Other Ambulatory Visit (HOSPITAL_COMMUNITY): Payer: Self-pay

## 2022-02-07 ENCOUNTER — Telehealth: Payer: Self-pay

## 2022-02-07 NOTE — Telephone Encounter (Signed)
Patient Advocate Encounter ? ?Received notification from PHARMACY that prior authorization for BREZTRI 160 MCG is required. ?  ?PA submitted on 02/07/2022 ?Key [id number] ?Status is pending ?  ?[facility] will continue to follow ? ? ?Patient Advocate  ?

## 2022-02-11 NOTE — Telephone Encounter (Signed)
Received notification from  [employer]  regarding a prior authorization for  [employer] . Authorization has been APPROVED from 02/07/22 to 02/08/23.  ? ?Authorization # (KeyDonnie [place]) [id number] ? ?Called pharmacy and advised. ? ? ?

## 2022-02-19 ENCOUNTER — Ambulatory Visit: Payer: Medicaid Other

## 2022-03-01 ENCOUNTER — Other Ambulatory Visit: Payer: Medicaid Other

## 2022-03-12 ENCOUNTER — Inpatient Hospital Stay: Admission: RE | Admit: 2022-03-12 | Payer: Medicaid Other | Source: Ambulatory Visit

## 2022-03-14 ENCOUNTER — Ambulatory Visit (HOSPITAL_COMMUNITY)
Admission: RE | Admit: 2022-03-14 | Discharge: 2022-03-14 | Disposition: A | Discharge status: Home / Self Care | Payer: Medicaid Other | Source: Ambulatory Visit | Attending: Student | Admitting: Student

## 2022-03-14 DIAGNOSIS — R942 Abnormal results of pulmonary function studies: Secondary | ICD-10-CM | POA: Insufficient documentation

## 2022-03-18 ENCOUNTER — Other Ambulatory Visit: Payer: Medicaid Other

## 2022-03-18 NOTE — Progress Notes (Unsigned)
Synopsis: Referred for asthma by Trey Sailors, PA  Subjective:   PATIENT ID: Amber Clarke GENDER: female DOB: 1976/04/08, MRN: [id number]  No chief complaint on file.  46yF with history of asthma, seizures. Had covid in 02/2021 wasn't severe. She is not vaccinated for covid-19.  She has been on breo since December taking 1 puff twice daily, not rinsing mouth afterward.   She has a very bad cough. Productive cough. Has been bad since Nov of last year. She does have sinus congestion, nasal drainage. She is on singulair has been on it for a while. She says it burns when she tried using flonase in past. SHe takes omeprazole before dinner.   She has DOE to half block maybe. Gradually worsening but breo has helped some.  Inhalers tried: Albuterol Advair diskus no problems Dulera made her vision blurry Memory [employer] likes it  COPD in mother and father.   She is from [place]. Moved to [place] 3 ya. Smokes half ppd. 2 blunts MJ. No vaping. No pets.   Interval HPI: Neti pot, flonase, chantix started last visit. HRCT chest with bronchial wall thickening some emphysema (subtle) and enlarged PA.   Need cbc/diff, IgE and to check tte with concern for PH.   Otherwise pertinent review of systems is negative.    Past Medical History:  Diagnosis Date   Asthma    Seizures ([place])      Family History  Problem Relation Age of Onset   Hypertension Mother    Diabetes Mother    COPD Mother    Asthma Mother    Hypertension Father    Asthma Father    Heart disease Sister    Heart disease Maternal Grandmother      Past Surgical History:  Procedure Laterality Date   CHOLECYSTECTOMY     TUBAL LIGATION      Social History   Socioeconomic History   Marital status: Single    Spouse name: Not on file   Number of children: Not on file   Years of education: Not on file   Highest education level: Not on file  Occupational History   Not on file  Tobacco Use   Smoking status: Every Day     Packs/day: 0.50    Years: 28.00    Pack years: 14.00    Types: Cigarettes   Smokeless tobacco: Never  Vaping Use   Vaping Use: Never used  Substance and Sexual Activity   Alcohol use: Not Currently   Drug use: Not Currently   Sexual activity: Yes    Birth control/protection: None  Other Topics Concern   Not on file  Social History Narrative   Not on file   Social Determinants of Health   Financial Resource Strain: Not on file  Food Insecurity: Not on file  Transportation Needs: Not on file  Physical Activity: Not on file  Stress: Not on file  Social Connections: Not on file  Intimate Partner Violence: Not on file     Allergies  Allergen Reactions   Keflex [Cephalexin]    Pollen Extract      Outpatient Medications Prior to Visit  Medication Sig Dispense Refill   albuterol (PROVENTIL HFA) 108 (90 Base) MCG/ACT inhaler Inhale 2 puffs into the lungs every 6 (six) hours as needed for wheezing or shortness of breath. 18 g 6   albuterol (PROVENTIL) (2.5 MG/3ML) 0.083% nebulizer solution Take 3 mLs (2.5 mg total) by nebulization every 6 (six) hours as needed for wheezing  or shortness of breath. 75 mL 12   Budeson-Glycopyrrol-Formoterol (BREZTRI AEROSPHERE) 160-9-4.8 MCG/ACT AERO Inhale 2 puffs into the lungs in the morning and at bedtime. 10.7 g 11   diclofenac Sodium (VOLTAREN) 1 % GEL Apply 2-4 g topically daily.     famotidine (PEPCID) 20 MG tablet Take 20 mg by mouth daily.     fluticasone (FLONASE) 50 MCG/ACT nasal spray Place 1 spray into both nostrils daily. 16 g 11   gabapentin (NEURONTIN) 300 MG capsule Take 300 mg by mouth 2 (two) times daily as needed.     levETIRAcetam (KEPPRA) 750 MG tablet Take 1 tablet (750 mg total) by mouth 2 (two) times daily. 60 tablet 6   meloxicam (MOBIC) 7.5 MG tablet TAKE 1 TABLET (7.5 MG TOTAL) BY MOUTH DAILY. 30 tablet 0   montelukast (SINGULAIR) 10 MG tablet Take 1 tablet (10 mg total) by mouth at bedtime. (Patient taking differently:  Take 10 mg by mouth at bedtime as needed (allergies).) 30 tablet 6   omeprazole (PRILOSEC) 20 MG capsule Take 20 mg by mouth daily.     Spacer/Aero-Holding Chambers DEVI 1 each by Does not apply route in the morning and at bedtime. Please use with MDI inhalers 1 each 0   tiZANidine (ZANAFLEX) 4 MG tablet TAKE 1 TABLET (4 MG TOTAL) BY MOUTH EVERY 6 (SIX) HOURS AS NEEDED FOR MUSCLE SPASMS. 60 tablet 2   varenicline (CHANTIX CONTINUING MONTH PAK) 1 MG tablet Take 1 tablet (1 mg total) by mouth 2 (two) times daily. 60 tablet 1   varenicline (CHANTIX PAK) 0.5 MG X 11 & 1 MG X 42 tablet Take one 0.5 mg tablet by mouth once daily for 3 days, then increase to one 0.5 mg tablet twice daily for 4 days, then increase to one 1 mg tablet twice daily. 53 tablet 0   No facility-administered medications prior to visit.       Objective:   Physical Exam:  General appearance: 46 y.o., female, NAD, conversant  Eyes: anicteric sclerae; PERRL, tracking appropriately HENT: NCAT; MMM, hoarse Neck: Trachea midline; no lymphadenopathy, no JVD Lungs: faint wheeze right lower lung field, with normal respiratory effort CV: RRR, no murmur  Abdomen: Soft, non-tender; non-distended, BS present  Extremities: No peripheral edema, warm Skin: Normal turgor and texture; no rash Psych: Appropriate affect Neuro: Alert and oriented to person and place, no focal deficit     There were no vitals filed for this visit.     on RA BMI Readings from Last 3 Encounters:  02/06/22 35.27 kg/m  12/14/21 36.11 kg/m  04/18/21 36.96 kg/m   Wt Readings from Last 3 Encounters:  02/06/22 180 lb 9.6 oz (81.9 kg)  12/14/21 178 lb 12.8 oz (81.1 kg)  04/18/21 183 lb (83 kg)     CBC    Component Value Date/Time   WBC 7.9 01/10/2021 2025   RBC 4.54 01/10/2021 2025   HGB 10.7 (L) 01/10/2021 2025   HCT 36.0 01/10/2021 2025   PLT 438 (H) 01/10/2021 2025   MCV 79.3 (L) 01/10/2021 2025   MCH 23.6 (L) 01/10/2021 2025   MCHC  29.7 (L) 01/10/2021 2025   RDW 19.9 (H) 01/10/2021 2025   LYMPHSABS 1.6 01/10/2021 2025   MONOABS 0.3 01/10/2021 2025   EOSABS 0.2 01/10/2021 2025   BASOSABS 0.1 01/10/2021 2025    Eos 200  Chest Imaging: CXR 12/16/21 reviewed by me accounting for difference in penetration not significantly changed  Pulmonary Functions Testing Results:  Latest Ref Rng & Units 02/06/2022   11:55 AM  PFT Results  FVC-Pre L 1.88    FVC-Predicted Pre % 73    FVC-Post L 1.91    FVC-Predicted Post % 74    Pre FEV1/FVC % % 48    Post FEV1/FCV % % 49    FEV1-Pre L 0.90    FEV1-Predicted Pre % 42    FEV1-Post L 0.93    DLCO uncorrected ml/min/mmHg 6.03    DLCO UNC% % 32    DLCO corrected ml/min/mmHg 6.03    DLCO COR %Predicted % 32    DLVA Predicted % 37    TLC L 4.36    TLC % Predicted % 97    RV % Predicted % 156    Severe obstruction, air trapping, severely reduced diffusing capacity     Assessment & Plan:   # OCS dependent severe persistent asthma/COPD overlap in exacerbation  # AR  # Excessive daytime sleepiness # Snoring  # Smoking Smoking cessation instruction/counseling given:  counseled patient on the dangers of tobacco use, advised patient to stop smoking, and reviewed strategies to maximize success   Plan: - finish prednisone taper - continue breztri 2 puffs twice daily with spacer rinse mouth and spacer after use - fluconazole for empiric tx of thrush failing to respond to nystatin - try using flonase after this 1 spray each nostril - neti pot rinse with distilled water or boiled then cooled water and salt pack - see instructions. This is for sinus congestion, crusting, and post-nasal drainage - chantix prescribed - could have emphysema with longstanding smoking but HRCT Chest ordered given severity of diffusion impairment and to evaluate for asthma mimics like ABPA, sarcoid - cbc/diff and IgE next visit  - can add zyrtec or claritin daily over the counter if allergy real  bad  RTC 6 weeks    Maryjane Hurter, MD [facility] 03/18/2022 4:21 PM

## 2022-03-20 ENCOUNTER — Ambulatory Visit: Payer: Medicaid Other

## 2022-03-20 ENCOUNTER — Encounter: Payer: Self-pay | Admitting: Student

## 2022-03-20 ENCOUNTER — Ambulatory Visit (INDEPENDENT_AMBULATORY_CARE_PROVIDER_SITE_OTHER): Payer: Medicaid Other | Admitting: Student

## 2022-03-20 VITALS — BP 124/68 | HR 101 | Temp 98.2°F | Ht 59.0 in | Wt 180.0 lb

## 2022-03-20 DIAGNOSIS — J4550 Severe persistent asthma, uncomplicated: Secondary | ICD-10-CM

## 2022-03-20 DIAGNOSIS — R0600 Dyspnea, unspecified: Secondary | ICD-10-CM

## 2022-03-20 DIAGNOSIS — F17200 Nicotine dependence, unspecified, uncomplicated: Secondary | ICD-10-CM

## 2022-03-20 LAB — CBC WITH DIFFERENTIAL/PLATELET
Basophils Absolute: 0.2 10*3/uL — ABNORMAL HIGH (ref 0.0–0.1)
Basophils Relative: 1.8 % (ref 0.0–3.0)
Eosinophils Absolute: 0.4 10*3/uL (ref 0.0–0.7)
Eosinophils Relative: 4.6 % (ref 0.0–5.0)
HCT: 34.1 % — ABNORMAL LOW (ref 36.0–46.0)
Hemoglobin: 10.2 g/dL — ABNORMAL LOW (ref 12.0–15.0)
Lymphocytes Relative: 28.9 % (ref 12.0–46.0)
Lymphs Abs: 2.6 10*3/uL (ref 0.7–4.0)
MCHC: 29.8 g/dL — ABNORMAL LOW (ref 30.0–36.0)
MCV: 66.4 fl — ABNORMAL LOW (ref 78.0–100.0)
Monocytes Absolute: 0.8 10*3/uL (ref 0.1–1.0)
Monocytes Relative: 9.2 % (ref 3.0–12.0)
Neutro Abs: 5 10*3/uL (ref 1.4–7.7)
Neutrophils Relative %: 55.5 % (ref 43.0–77.0)
Platelets: 521 10*3/uL — ABNORMAL HIGH (ref 150.0–400.0)
RBC: 5.14 Mil/uL — ABNORMAL HIGH (ref 3.87–5.11)
RDW: 21.4 % — ABNORMAL HIGH (ref 11.5–15.5)
WBC: 9 10*3/uL (ref 4.0–10.5)

## 2022-03-20 MED ORDER — VARENICLINE TARTRATE 1 MG PO TABS: 1.0000 mg | ORAL_TABLET | Freq: Two times a day (BID) | ORAL | 1 refills | Status: DC

## 2022-03-20 MED ORDER — VARENICLINE TARTRATE 0.5 MG X 11 & 1 MG X 42 PO TBPK: ORAL_TABLET | ORAL | 0 refills | Status: DC

## 2022-03-20 NOTE — Addendum Note (Signed)
Addended by: Christen Butter on: 03/20/2022 12:05 PM   Modules accepted: Orders

## 2022-03-20 NOTE — Patient Instructions (Signed)
-   lab today  - chantix - see instructions to gradually increase dose and ideally stop smoking a week after starting chantix - Breztri 2 puffs twice daily with spacer rinse mouth and spacer after use - flutter valve 10 slow but firm puffs twice daily  - flonase 1 spray each nostril after clearing your nose out of crusting following a shower - neti pot rinse with distilled water or boiled then cooled water and salt pack - see instructions. This is for sinus congestion, crusting, and post-nasal drainage - can add zyrtec or claritin daily over the counter if allergy real bad - STOP SMOKING!!!

## 2022-03-21 LAB — IGE: IgE (Immunoglobulin E), Serum: 59 kU/L (ref <=114)

## 2022-03-26 LAB — ASPERGILLUS IGE PANEL
A. Amstel/Glaucu Class Interp: 0
A. Flavus Class Interp: 0
A. Fumigatus Class Interp: 0
A. Nidulans Class Interp: 0
A. Niger Class Interp: 0
A. Versicolor Class Interp: 0
Aspergillus amstel/glaucu IgE*: <0.35 kU/L (ref <0.35)
Aspergillus flavus IgE: <0.35 kU/L (ref <0.35)
Aspergillus fumigatus IgE: <0.1 kU/L (ref <0.35)
Aspergillus nidulans IgE: <0.35 kU/L (ref <0.35)
Aspergillus niger IgE: <0.1 kU/L (ref <0.35)
Aspergillus versicolor IgE: <0.1 kU/L (ref <0.35)

## 2022-03-29 ENCOUNTER — Ambulatory Visit (HOSPITAL_COMMUNITY)
Admission: RE | Admit: 2022-03-29 | Discharge: 2022-03-29 | Disposition: A | Discharge status: Home / Self Care | Payer: Medicaid Other | Source: Ambulatory Visit | Attending: Student | Admitting: Student

## 2022-03-29 ENCOUNTER — Other Ambulatory Visit: Payer: Medicaid Other

## 2022-03-29 DIAGNOSIS — I272 Pulmonary hypertension, unspecified: Secondary | ICD-10-CM | POA: Diagnosis present

## 2022-03-29 DIAGNOSIS — R0609 Other forms of dyspnea: Secondary | ICD-10-CM | POA: Diagnosis not present

## 2022-03-29 DIAGNOSIS — R0600 Dyspnea, unspecified: Secondary | ICD-10-CM | POA: Diagnosis not present

## 2022-03-29 LAB — ECHOCARDIOGRAM COMPLETE
AR max vel: 2.21 cm2
AV Peak grad: 10.4 mmHg
Ao pk vel: 1.61 m/s
Area-P 1/2: 5.16 cm2
S' Lateral: 2.6 cm

## 2022-04-04 ENCOUNTER — Ambulatory Visit: Payer: Medicaid Other

## 2022-04-11 ENCOUNTER — Telehealth: Payer: Medicaid Other | Admitting: Physician Assistant

## 2022-04-11 DIAGNOSIS — R3989 Other symptoms and signs involving the genitourinary system: Secondary | ICD-10-CM

## 2022-04-12 NOTE — Progress Notes (Signed)
   Thank you for the details you included in the comment boxes. Those details are very helpful in determining the best course of treatment for you and help [place] to provide the best care.Because it seems you have 2 complaints: vaginal discharge, plus UTI symptoms, we recommend that you convert this visit to a video visit in order for the provider to better assess what is going on.  The provider will be able to give you a more accurate diagnosis and treatment plan if we can more freely discuss your symptoms and with the addition of a virtual examination.   If you convert to a video visit, we will bill your insurance (similar to an office visit) and you will not be charged for this e-Visit. You will be able to stay at home and speak with the first available [facility] advanced practice provider. The link to do a video visit is in the drop down Menu tab of your Welcome screen in MyChart.  I provided 5 minutes of non face-to-face time during this encounter for chart review and documentation.

## 2022-04-29 NOTE — Progress Notes (Deleted)
Synopsis: Referred for asthma by Norm Salt, PA  Subjective:   PATIENT ID: Amber Clarke GENDER: female DOB: [birth year], MRN: [id number]  No chief complaint on file.  46yF with history of asthma, seizures. Had covid in 02/2021 wasn't severe. She is not vaccinated for covid-19.  She has been on breo since December taking 1 puff twice daily, not rinsing mouth afterward.   She has a very bad cough. Productive cough. Has been bad since Nov of last year. She does have sinus congestion, nasal drainage. She is on singulair has been on it for a while. She says it burns when she tried using flonase in past. SHe takes omeprazole before dinner.   She has DOE to half block maybe. Gradually worsening but breo has helped some.  Inhalers tried: Albuterol Advair diskus no problems Dulera made her vision blurry Virgel Bouquet likes it  COPD in mother and father.   She is from [place]. Moved to [place] 3 ya. Smokes half ppd. 2 blunts MJ. No vaping. No pets.   Interval HPI: Neti pot, flonase, chantix started last visit. Breztri is helpful but does make her lose her voice. HRCT chest with bronchial wall thickening some emphysema (subtle) and enlarged PA.   Hard for her to tell how she's doing from DOE standpoint. Cough is about the same.   Need cbc/diff, IgE and to check tte with concern for PH.   Did try chantix and was able to stop briefly, now down to 3 cigarettes. She is open to trying chantix again.   Otherwise pertinent review of systems is negative.    Past Medical History:  Diagnosis Date   Asthma    Seizures (HCC)      Family History  Problem Relation Age of Onset   Hypertension Mother    Diabetes Mother    COPD Mother    Asthma Mother    Hypertension Father    Asthma Father    Heart disease Sister    Heart disease Maternal Grandmother      Past Surgical History:  Procedure Laterality Date   CHOLECYSTECTOMY     TUBAL LIGATION      Social History   Socioeconomic History    Marital status: Single    Spouse name: Not on file   Number of children: Not on file   Years of education: Not on file   Highest education level: Not on file  Occupational History   Not on file  Tobacco Use   Smoking status: Every Day    Packs/day: 0.50    Years: 28.00    Total pack years: 14.00    Types: Cigarettes   Smokeless tobacco: Never   Tobacco comments:    Pt states she is down to 3 gigs a day   Vaping Use   Vaping Use: Never used  Substance and Sexual Activity   Alcohol use: Not Currently   Drug use: Not Currently   Sexual activity: Yes    Birth control/protection: None  Other Topics Concern   Not on file  Social History Narrative   Not on file   Social Determinants of Health   Financial Resource Strain: Not on file  Food Insecurity: Not on file  Transportation Needs: Not on file  Physical Activity: Not on file  Stress: Not on file  Social Connections: Not on file  Intimate Partner Violence: Not on file     Allergies  Allergen Reactions   Keflex [Cephalexin]    Pollen Extract  Outpatient Medications Prior to Visit  Medication Sig Dispense Refill   albuterol (PROVENTIL HFA) 108 (90 Base) MCG/ACT inhaler Inhale 2 puffs into the lungs every 6 (six) hours as needed for wheezing or shortness of breath. 18 g 6   albuterol (PROVENTIL) (2.5 MG/3ML) 0.083% nebulizer solution Take 3 mLs (2.5 mg total) by nebulization every 6 (six) hours as needed for wheezing or shortness of breath. 75 mL 12   ALPRAZolam (XANAX) 1 MG tablet Take 1 mg by mouth 2 (two) times daily.     Budeson-Glycopyrrol-Formoterol (BREZTRI AEROSPHERE) 160-9-4.8 MCG/ACT AERO Inhale 2 puffs into the lungs in the morning and at bedtime. 10.7 g 11   diclofenac Sodium (VOLTAREN) 1 % GEL Apply 2-4 g topically daily.     famotidine (PEPCID) 20 MG tablet Take 20 mg by mouth daily.     fluticasone (FLONASE) 50 MCG/ACT nasal spray Place 1 spray into both nostrils daily. 16 g 11   gabapentin  (NEURONTIN) 300 MG capsule Take 300 mg by mouth 2 (two) times daily as needed.     levETIRAcetam (KEPPRA) 750 MG tablet Take 1 tablet (750 mg total) by mouth 2 (two) times daily. 60 tablet 6   meloxicam (MOBIC) 7.5 MG tablet TAKE 1 TABLET (7.5 MG TOTAL) BY MOUTH DAILY. 30 tablet 0   montelukast (SINGULAIR) 10 MG tablet Take 1 tablet (10 mg total) by mouth at bedtime. (Patient taking differently: Take 10 mg by mouth at bedtime as needed (allergies).) 30 tablet 6   omeprazole (PRILOSEC) 20 MG capsule Take 20 mg by mouth daily.     sertraline (ZOLOFT) 50 MG tablet Take by mouth.     Spacer/Aero-Holding Chambers DEVI 1 each by Does not apply route in the morning and at bedtime. Please use with MDI inhalers 1 each 0   tiZANidine (ZANAFLEX) 4 MG tablet TAKE 1 TABLET (4 MG TOTAL) BY MOUTH EVERY 6 (SIX) HOURS AS NEEDED FOR MUSCLE SPASMS. 60 tablet 2   varenicline (CHANTIX CONTINUING MONTH PAK) 1 MG tablet Take 1 tablet (1 mg total) by mouth 2 (two) times daily. 60 tablet 1   varenicline (CHANTIX PAK) 0.5 MG X 11 & 1 MG X 42 tablet Take one 0.5 mg tablet by mouth once daily for 3 days, then increase to one 0.5 mg tablet twice daily for 4 days, then increase to one 1 mg tablet twice daily. 53 tablet 0   No facility-administered medications prior to visit.       Objective:   Physical Exam:  General appearance: 46 y.o., female, NAD, conversant, female, NAD, conversant  Eyes: anicteric sclerae; PERRL, tracking appropriately HENT: NCAT; MMM, hoarse Neck: Trachea midline; no lymphadenopathy, no JVD Lungs: faint wheeze bl which doesn't clear with cough, with normal respiratory effort CV: RRR, no murmur  Abdomen: Soft, non-tender; non-distended, BS present  Extremities: No peripheral edema, warm Skin: Normal turgor and texture; no rash Psych: Appropriate affect Neuro: Alert and oriented to person and place, no focal deficit     There were no vitals filed for this visit.      on RA BMI Readings from Last 3 Encounters:   03/20/22 36.36 kg/m  02/06/22 35.27 kg/m  12/14/21 36.11 kg/m   Wt Readings from Last 3 Encounters:  03/20/22 180 lb (81.6 kg)  02/06/22 180 lb 9.6 oz (81.9 kg)  12/14/21 178 lb 12.8 oz (81.1 kg)     CBC    Component Value Date/Time   WBC 9.0 03/20/2022 1039   RBC 5.14 (H) 03/20/2022  1039   HGB 10.2 (L) 03/20/2022 1039   HCT 34.1 (L) 03/20/2022 1039   PLT 521.0 (H) 03/20/2022 1039   MCV 66.4 Repeated and verified X2. (L) 03/20/2022 1039   MCH 23.6 (L) 01/10/2021 2025   MCHC 29.8 (L) 03/20/2022 1039   RDW 21.4 (H) 03/20/2022 1039   LYMPHSABS 2.6 03/20/2022 1039   MONOABS 0.8 03/20/2022 1039   EOSABS 0.4 03/20/2022 1039   BASOSABS 0.2 (H) 03/20/2022 1039    Eos 200  Chest Imaging: CXR 12/16/21 reviewed by me accounting for difference in penetration not significantly changed  HRCT Chest 03/14/22 reviewed by me with enlarged PA, bronchial wall thickening, borderline bronchiolectasis, mild emphysema  Pulmonary Functions Testing Results:    Latest Ref Rng & Units 02/06/2022   11:55 AM  PFT Results  FVC-Pre L 1.88   FVC-Predicted Pre % 73   FVC-Post L 1.91   FVC-Predicted Post % 74   Pre FEV1/FVC % % 48   Post FEV1/FCV % % 49   FEV1-Pre L 0.90   FEV1-Predicted Pre % 42   FEV1-Post L 0.93   DLCO uncorrected ml/min/mmHg 6.03   DLCO UNC% % 32   DLCO corrected ml/min/mmHg 6.03   DLCO COR %Predicted % 32   DLVA Predicted % 37   TLC L 4.36   TLC % Predicted % 97   RV % Predicted % 156   Severe obstruction, air trapping, severely reduced diffusing capacity     TTE 03/29/22   1. Left ventricular ejection fraction, by estimation, is 60 to 65%. The  left ventricle has normal function. The left ventricle has no regional  wall motion abnormalities. Left ventricular diastolic parameters were  normal.   2. Right ventricular systolic function is normal. The right ventricular  size is normal. Tricuspid regurgitation signal is inadequate for assessing  PA pressure.   3.  The mitral valve is grossly normal. No evidence of mitral valve  regurgitation. No evidence of mitral stenosis.   4. The aortic valve was not well visualized. Aortic valve regurgitation  is not visualized. No aortic stenosis is present.   5. The inferior vena cava is normal in size with greater than 50%  respiratory variability, suggesting right atrial pressure of 3 mmHg.  Assessment & Plan:   # OCS dependent severe persistent asthma/COPD overlap  # AR  # Excessive daytime sleepiness # Snoring  # Smoking Smoking cessation instruction/counseling given:  counseled patient on the dangers of tobacco use, advised patient to stop smoking, and reviewed strategies to maximize success   Plan: - finish prednisone taper - continue breztri 2 puffs twice daily with spacer rinse mouth and spacer after use - start flutter valve 10 slow but firm puffs twice daily  - neti pot rinse with distilled water or boiled then cooled water and salt pack - see instructions. This is for sinus congestion, crusting, and post-nasal drainage - flonase after this 1 spray each nostril - chantix prescribed again - cbc/diff and IgE - echo - can add zyrtec or claritin daily over the counter if allergy real bad  RTC 6 weeks    Omar Person, MD [facility] 04/29/2022 10:57 AM

## 2022-05-01 ENCOUNTER — Ambulatory Visit: Payer: Medicaid Other | Admitting: Student

## 2022-06-03 ENCOUNTER — Telehealth: Payer: Self-pay | Admitting: Student

## 2022-06-03 NOTE — Telephone Encounter (Signed)
Needs to go to nearest  ER as baseline lung function is extremely poor and likely will need admit

## 2022-06-03 NOTE — Telephone Encounter (Signed)
Called and spoke with pt who states that she started having an asthma flare yesterday 8/20. Pt said she has been overusing her rescue inhaler which is not helping with her symptoms. States that she has been doing breathing treatments every hour to see if that would help with her symptoms and she said that it has given her some relief.  Pt is using her Breztri inhaler as prescribed and is taking the Singulair as prescribed.  Pt has complaints of increased SOB, wheezing, and is also coughing getting up yellow phlegm that is thick in consistency.  Pt wants to know what could be recommended to help with her symptoms.  With Dr. Thora Lance not being available today, sending to provider of the day. Dr. Sherene Sires, please advise.

## 2022-06-03 NOTE — Telephone Encounter (Signed)
Called and spoke with pt letting her know recs per MW and she verbalized understanding. Pt said she was going to go to the ED as recommended. Nothing further needed.

## 2022-06-04 ENCOUNTER — Ambulatory Visit (INDEPENDENT_AMBULATORY_CARE_PROVIDER_SITE_OTHER): Payer: Medicaid Other | Admitting: Student

## 2022-06-04 ENCOUNTER — Encounter: Payer: Self-pay | Admitting: Student

## 2022-06-04 VITALS — BP 118/82 | HR 114 | Temp 98.8°F | Ht 59.0 in | Wt 169.0 lb

## 2022-06-04 DIAGNOSIS — J45901 Unspecified asthma with (acute) exacerbation: Secondary | ICD-10-CM

## 2022-06-04 DIAGNOSIS — J441 Chronic obstructive pulmonary disease with (acute) exacerbation: Secondary | ICD-10-CM | POA: Diagnosis not present

## 2022-06-04 DIAGNOSIS — F17200 Nicotine dependence, unspecified, uncomplicated: Secondary | ICD-10-CM

## 2022-06-04 MED ORDER — PREDNISONE 10 MG PO TABS: ORAL_TABLET | ORAL | 0 refills | Status: DC

## 2022-06-04 MED ORDER — NICOTINE 7 MG/24HR TD PT24: 7.0000 mg | MEDICATED_PATCH | Freq: Every day | TRANSDERMAL | 1 refills | Status: DC

## 2022-06-04 MED ORDER — METHYLPREDNISOLONE ACETATE 80 MG/ML IJ SUSP: 80.0000 mg | Freq: Once | INTRAMUSCULAR | Status: AC

## 2022-06-04 MED ORDER — METHYLPREDNISOLONE ACETATE 80 MG/ML IJ SUSP
80.0000 mg | Freq: Once | INTRAMUSCULAR | Status: AC
  Administered 2022-06-04: 80 mg via INTRAMUSCULAR

## 2022-06-04 NOTE — Patient Instructions (Addendum)
-   prednisone taper prescribed - stay on chantix and would start also using nicotine patch daily - if you can get off of cigarettes then we could explore injectable medicines that are super effective for patients on asthma spectrum - Breztri 2 puffs twice daily with spacer rinse mouth and spacer after use - flutter valve 10 slow but firm puffs twice daily  - flonase 1 spray each nostril after clearing your nose out of crusting following a shower - neti pot rinse with distilled water or boiled then cooled water and salt pack - see instructions. This is for sinus congestion, crusting, and post-nasal drainage - can add zyrtec or claritin daily over the counter if allergy real bad

## 2022-06-04 NOTE — Progress Notes (Signed)
Synopsis: Referred for asthma by Norm Salt, PA  Subjective:   PATIENT ID: Amber Clarke GENDER: female DOB: [birth year], MRN: [id number]  Chief Complaint  Patient presents with   Follow-up    Increased SOB, wheezing, chest tightness and cough with clear sputum for the past wk. She is still on chantix and is down to 4 cigs per day.    45yF with history of asthma, seizures. Had covid in 02/2021 wasn't severe. She is not vaccinated for covid-19.  She has been on breo since December taking 1 puff twice daily, not rinsing mouth afterward.   She has a very bad cough. Productive cough. Has been bad since Nov of last year. She does have sinus congestion, nasal drainage. She is on singulair has been on it for a while. She says it burns when she tried using flonase in past. SHe takes omeprazole before dinner.   She has DOE to half block maybe. Gradually worsening but breo has helped some.  Inhalers tried: Albuterol Advair diskus no problems Dulera made her vision blurry Virgel Bouquet likes it  COPD in mother and father.   She is from [place]. Moved to [place] 3 ya. Smokes half ppd. 2 blunts MJ. No vaping. No pets.   Interval HPI:  She says that she 'can't breathe.' Worsening over last week, especially over last 4 days. She initially attributed to change in climate getting back from [place]. No fever. She does have cough -clearish. She hasn't had recent course of prednisone - no ED/urgent care visits or hospitalizations since last visit with our clinic.   Otherwise pertinent review of systems is negative.    Past Medical History:  Diagnosis Date   Asthma    Seizures (HCC)      Family History  Problem Relation Age of Onset   Hypertension Mother    Diabetes Mother    COPD Mother    Asthma Mother    Hypertension Father    Asthma Father    Heart disease Sister    Heart disease Maternal Grandmother      Past Surgical History:  Procedure Laterality Date   CHOLECYSTECTOMY     TUBAL  LIGATION      Social History   Socioeconomic History   Marital status: Single    Spouse name: Not on file   Number of children: Not on file   Years of education: Not on file   Highest education level: Not on file  Occupational History   Not on file  Tobacco Use   Smoking status: Every Day    Packs/day: 0.50    Years: 28.00    Total pack years: 14.00    Types: Cigarettes   Smokeless tobacco: Never   Tobacco comments:    Pt states she is down to 3 gigs a day   Vaping Use   Vaping Use: Never used  Substance and Sexual Activity   Alcohol use: Not Currently   Drug use: Not Currently   Sexual activity: Yes    Birth control/protection: None  Other Topics Concern   Not on file  Social History Narrative   Not on file   Social Determinants of Health   Financial Resource Strain: Not on file  Food Insecurity: Not on file  Transportation Needs: Not on file  Physical Activity: Not on file  Stress: Not on file  Social Connections: Not on file  Intimate Partner Violence: Not on file     Allergies  Allergen Reactions   Keflex [  Cephalexin]    Pollen Extract      Outpatient Medications Prior to Visit  Medication Sig Dispense Refill   albuterol (PROVENTIL HFA) 108 (90 Base) MCG/ACT inhaler Inhale 2 puffs into the lungs every 6 (six) hours as needed for wheezing or shortness of breath. 18 g 6   albuterol (PROVENTIL) (2.5 MG/3ML) 0.083% nebulizer solution Take 3 mLs (2.5 mg total) by nebulization every 6 (six) hours as needed for wheezing or shortness of breath. 75 mL 12   ALPRAZolam (XANAX) 1 MG tablet Take 1 mg by mouth 2 (two) times daily.     Budeson-Glycopyrrol-Formoterol (BREZTRI AEROSPHERE) 160-9-4.8 MCG/ACT AERO Inhale 2 puffs into the lungs in the morning and at bedtime. 10.7 g 11   diclofenac Sodium (VOLTAREN) 1 % GEL Apply 2-4 g topically daily.     famotidine (PEPCID) 20 MG tablet Take 20 mg by mouth daily.     fluticasone (FLONASE) 50 MCG/ACT nasal spray Place 1  spray into both nostrils daily. 16 g 11   levETIRAcetam (KEPPRA) 750 MG tablet Take 1 tablet (750 mg total) by mouth 2 (two) times daily. 60 tablet 6   meloxicam (MOBIC) 7.5 MG tablet TAKE 1 TABLET (7.5 MG TOTAL) BY MOUTH DAILY. 30 tablet 0   montelukast (SINGULAIR) 10 MG tablet Take 1 tablet (10 mg total) by mouth at bedtime. (Patient taking differently: Take 10 mg by mouth at bedtime as needed (allergies).) 30 tablet 6   omeprazole (PRILOSEC) 20 MG capsule Take 20 mg by mouth daily.     sertraline (ZOLOFT) 50 MG tablet Take by mouth.     Spacer/Aero-Holding Chambers DEVI 1 each by Does not apply route in the morning and at bedtime. Please use with MDI inhalers 1 each 0   tiZANidine (ZANAFLEX) 4 MG tablet TAKE 1 TABLET (4 MG TOTAL) BY MOUTH EVERY 6 (SIX) HOURS AS NEEDED FOR MUSCLE SPASMS. 60 tablet 2   varenicline (CHANTIX CONTINUING MONTH PAK) 1 MG tablet Take 1 tablet (1 mg total) by mouth 2 (two) times daily. 60 tablet 1   gabapentin (NEURONTIN) 300 MG capsule Take 300 mg by mouth 2 (two) times daily as needed.     varenicline (CHANTIX PAK) 0.5 MG X 11 & 1 MG X 42 tablet Take one 0.5 mg tablet by mouth once daily for 3 days, then increase to one 0.5 mg tablet twice daily for 4 days, then increase to one 1 mg tablet twice daily. 53 tablet 0   No facility-administered medications prior to visit.       Objective:   Physical Exam:  General appearance: 46 y.o., female, NAD, conversant  Eyes: anicteric sclerae; PERRL, tracking appropriately HENT: NCAT; MMM, hoarse Neck: Trachea midline; no lymphadenopathy, no JVD Lungs: wheeze bl some of which clears with cough, with mildly increased respiratory effort CV: RRR, no murmur  Abdomen: Soft, non-tender; non-distended, BS present  Extremities: No peripheral edema, warm Skin: Normal turgor and texture; no rash Psych: Appropriate affect Neuro: Alert and oriented to person and place, no focal deficit     Vitals:   06/04/22 0942  BP: 118/82   Pulse: (!) 114  Temp: 98.8 F (37.1 C)  TempSrc: Oral  SpO2: 95%  Weight: 169 lb (76.7 kg)  Height: 4\' 11"  (1.499 m)     95% on RA BMI Readings from Last 3 Encounters:  06/04/22 34.13 kg/m  03/20/22 36.36 kg/m  02/06/22 35.27 kg/m   Wt Readings from Last 3 Encounters:  06/04/22 169 lb (76.7  kg)  03/20/22 180 lb (81.6 kg)  02/06/22 180 lb 9.6 oz (81.9 kg)     CBC    Component Value Date/Time   WBC 9.0 03/20/2022 1039   RBC 5.14 (H) 03/20/2022 1039   HGB 10.2 (L) 03/20/2022 1039   HCT 34.1 (L) 03/20/2022 1039   PLT 521.0 (H) 03/20/2022 1039   MCV 66.4 Repeated and verified X2. (L) 03/20/2022 1039   MCH 23.6 (L) 01/10/2021 2025   MCHC 29.8 (L) 03/20/2022 1039   RDW 21.4 (H) 03/20/2022 1039   LYMPHSABS 2.6 03/20/2022 1039   MONOABS 0.8 03/20/2022 1039   EOSABS 0.4 03/20/2022 1039   BASOSABS 0.2 (H) 03/20/2022 1039    Eos 200  Chest Imaging: CXR 12/16/21 reviewed by me accounting for difference in penetration not significantly changed  HRCT Chest 03/14/22 reviewed by me with enlarged PA, bronchial wall thickening, borderline bronchiolectasis, mild emphysema  Pulmonary Functions Testing Results:    Latest Ref Rng & Units 02/06/2022   11:55 AM  PFT Results  FVC-Pre L 1.88   FVC-Predicted Pre % 73   FVC-Post L 1.91   FVC-Predicted Post % 74   Pre FEV1/FVC % % 48   Post FEV1/FCV % % 49   FEV1-Pre L 0.90   FEV1-Predicted Pre % 42   FEV1-Post L 0.93   DLCO uncorrected ml/min/mmHg 6.03   DLCO UNC% % 32   DLCO corrected ml/min/mmHg 6.03   DLCO COR %Predicted % 32   DLVA Predicted % 37   TLC L 4.36   TLC % Predicted % 97   RV % Predicted % 156   Severe obstruction, air trapping, severely reduced diffusing capacity     Assessment & Plan:   # OCS dependent severe persistent asthma/COPD overlap in exacerbation  # AR  # Excessive daytime sleepiness # Snoring  # Smoking Smoking cessation instruction/counseling given:  counseled patient on the dangers of  tobacco use, advised patient to stop smoking, and reviewed strategies to maximize success   Plan: - medrol 80 mg IM today, prednisone taper prescribed - stay on chantix and would start also using nicotine patch daily - if you can get off of cigarettes then we could explore injectable medicines that are super effective for patients on asthma spectrum - Breztri 2 puffs twice daily with spacer rinse mouth and spacer after use - flutter valve 10 slow but firm puffs twice daily  - flonase 1 spray each nostril after clearing your nose out of crusting following a shower - neti pot rinse with distilled water or boiled then cooled water and salt pack - see instructions. This is for sinus congestion, crusting, and post-nasal drainage - home sleep study ordered previous visit  RTC 6 weeks    Omar Person, MD [facility] 06/04/2022 10:04 AM

## 2022-06-04 NOTE — Addendum Note (Signed)
Addended by: Christen Butter on: 06/04/2022 10:52 AM   Modules accepted: Orders

## 2022-06-20 ENCOUNTER — Other Ambulatory Visit: Payer: Self-pay | Admitting: Student

## 2022-07-16 ENCOUNTER — Other Ambulatory Visit: Payer: Self-pay | Admitting: Student

## 2022-08-01 ENCOUNTER — Encounter: Payer: Self-pay | Admitting: Student

## 2022-08-21 ENCOUNTER — Ambulatory Visit: Payer: Medicaid Other | Admitting: Student

## 2022-09-13 ENCOUNTER — Encounter: Payer: Self-pay | Admitting: Student

## 2022-09-13 ENCOUNTER — Ambulatory Visit (INDEPENDENT_AMBULATORY_CARE_PROVIDER_SITE_OTHER): Payer: Medicaid Other | Admitting: Student

## 2022-09-13 VITALS — BP 104/64 | HR 124 | Temp 97.9°F | Ht 59.0 in | Wt 167.4 lb

## 2022-09-13 DIAGNOSIS — J4551 Severe persistent asthma with (acute) exacerbation: Secondary | ICD-10-CM | POA: Diagnosis not present

## 2022-09-13 DIAGNOSIS — R0683 Snoring: Secondary | ICD-10-CM | POA: Diagnosis not present

## 2022-09-13 MED ORDER — AZITHROMYCIN 250 MG PO TABS: ORAL_TABLET | ORAL | 0 refills | Status: DC

## 2022-09-13 MED ORDER — MONTELUKAST SODIUM 10 MG PO TABS: 10.0000 mg | ORAL_TABLET | Freq: Every day | ORAL | 6 refills | Status: DC

## 2022-09-13 MED ORDER — VARENICLINE TARTRATE 1 MG PO TABS: 1.0000 mg | ORAL_TABLET | Freq: Two times a day (BID) | ORAL | 1 refills | Status: DC

## 2022-09-13 MED ORDER — PREDNISONE 10 MG PO TABS: ORAL_TABLET | ORAL | 0 refills | Status: DC

## 2022-09-13 NOTE — Patient Instructions (Addendum)
-   prednisone taper prescribed, azithromycin - keep up the good work with cutting back on cigs. stay on chantix and would start also using nicotine patch daily - we'll see if we can get you dupixent for severe persistent asthma, eosinophilic asthma - Breztri 2 puffs twice daily with spacer rinse mouth and spacer after use - flutter valve 10 slow but firm puffs twice daily  - flonase 1 spray each nostril after clearing your nose out of crusting following a shower - neti pot rinse with distilled water or boiled then cooled water and salt pack - see instructions. This is for sinus congestion, crusting, and post-nasal drainage - can add zyrtec or claritin daily over the counter if allergy real bad

## 2022-09-13 NOTE — Progress Notes (Signed)
Synopsis: Referred for asthma by Norm Salt, PA  Subjective:   PATIENT ID: Amber Clarke GENDER: female DOB: Nov 14, 1975, MRN: [id number]  Chief Complaint  Patient presents with   Acute Visit    Wheeze, cough, sore throat x 1 week.   46yF with history of asthma, seizures. Had covid in 02/2021 wasn't severe. She is not vaccinated for covid-19.  She has been on breo since December taking 1 puff twice daily, not rinsing mouth afterward.   She has a very bad cough. Productive cough. Has been bad since Nov of last year. She does have sinus congestion, nasal drainage. She is on singulair has been on it for a while. She says it burns when she tried using flonase in past. SHe takes omeprazole before dinner.   She has DOE to half block maybe. Gradually worsening but breo has helped some.  Inhalers tried: Albuterol Advair diskus no problems Dulera made her vision blurry Virgel Bouquet likes it  COPD in mother and father.   She is from [place]. Moved to [place] 3 ya. Smokes half ppd. 2 blunts MJ. No vaping. No pets.   Interval HPI:  She says that she 'can't breathe.' Worsening over last week, especially over last 4 days. She initially attributed to change in climate getting back from [place]. No fever. She does have cough -clearish. She hasn't had recent course of prednisone - no ED/urgent care visits or hospitalizations since last visit with our clinic.   ------------------------ Last visit treated for asthma-copd exacerbation  Wheeze, cough, sore throat over last week. No fever. Down to 1 cigarette/day, uses nicotine patches. Coughing up yellowish/thick sputum.   Otherwise pertinent review of systems is negative.    Past Medical History:  Diagnosis Date   Asthma    Seizures (HCC)      Family History  Problem Relation Age of Onset   Hypertension Mother    Diabetes Mother    COPD Mother    Asthma Mother    Hypertension Father    Asthma Father    Heart disease Sister    Heart  disease Maternal Grandmother      Past Surgical History:  Procedure Laterality Date   CHOLECYSTECTOMY     TUBAL LIGATION      Social History   Socioeconomic History   Marital status: Single    Spouse name: Not on file   Number of children: Not on file   Years of education: Not on file   Highest education level: Not on file  Occupational History   Not on file  Tobacco Use   Smoking status: Some Days    Packs/day: 0.25    Years: 28.00    Total pack years: 7.00    Types: Cigarettes   Smokeless tobacco: Never   Tobacco comments:    Pt states she is down to 1 cigarette a day   Vaping Use   Vaping Use: Never used  Substance and Sexual Activity   Alcohol use: Not Currently   Drug use: Not Currently   Sexual activity: Yes    Birth control/protection: None  Other Topics Concern   Not on file  Social History Narrative   Not on file   Social Determinants of Health   Financial Resource Strain: Not on file  Food Insecurity: Not on file  Transportation Needs: Not on file  Physical Activity: Not on file  Stress: Not on file  Social Connections: Not on file  Intimate Partner Violence: Not on file  Allergies  Allergen Reactions   Keflex [Cephalexin]    Pollen Extract      Outpatient Medications Prior to Visit  Medication Sig Dispense Refill   albuterol (PROVENTIL HFA) 108 (90 Base) MCG/ACT inhaler Inhale 2 puffs into the lungs every 6 (six) hours as needed for wheezing or shortness of breath. 18 g 6   albuterol (PROVENTIL) (2.5 MG/3ML) 0.083% nebulizer solution Take 3 mLs (2.5 mg total) by nebulization every 6 (six) hours as needed for wheezing or shortness of breath. 75 mL 12   ALPRAZolam (XANAX) 1 MG tablet Take 1 mg by mouth 2 (two) times daily.     Budeson-Glycopyrrol-Formoterol (BREZTRI AEROSPHERE) 160-9-4.8 MCG/ACT AERO Inhale 2 puffs into the lungs in the morning and at bedtime. 10.7 g 11   diclofenac Sodium (VOLTAREN) 1 % GEL Apply 2-4 g topically daily.      famotidine (PEPCID) 20 MG tablet Take 20 mg by mouth daily.     fluticasone (FLONASE) 50 MCG/ACT nasal spray Place 1 spray into both nostrils daily. 16 g 11   levETIRAcetam (KEPPRA) 750 MG tablet Take 1 tablet (750 mg total) by mouth 2 (two) times daily. 60 tablet 6   meloxicam (MOBIC) 7.5 MG tablet TAKE 1 TABLET (7.5 MG TOTAL) BY MOUTH DAILY. 30 tablet 0   nicotine (NICODERM CQ - DOSED IN MG/24 HR) 7 mg/24hr patch PLACE 1 PATCH (7 MG TOTAL) ONTO THE SKIN DAILY. 30 patch 11   omeprazole (PRILOSEC) 20 MG capsule Take 20 mg by mouth daily.     sertraline (ZOLOFT) 50 MG tablet Take by mouth.     Spacer/Aero-Holding Chambers DEVI 1 each by Does not apply route in the morning and at bedtime. Please use with MDI inhalers 1 each 0   tiZANidine (ZANAFLEX) 4 MG tablet TAKE 1 TABLET (4 MG TOTAL) BY MOUTH EVERY 6 (SIX) HOURS AS NEEDED FOR MUSCLE SPASMS. 60 tablet 2   montelukast (SINGULAIR) 10 MG tablet Take 1 tablet (10 mg total) by mouth at bedtime. (Patient taking differently: Take 10 mg by mouth at bedtime as needed (allergies).) 30 tablet 6   varenicline (CHANTIX CONTINUING MONTH PAK) 1 MG tablet Take 1 tablet (1 mg total) by mouth 2 (two) times daily. 60 tablet 1   predniSONE (DELTASONE) 10 MG tablet Take 4 tabs by mouth for 3 days, then 3 for 3 days, 2 for 3 days, 1 for 3 days and stop (Patient not taking: Reported on 09/13/2022) 30 tablet 0   Facility-Administered Medications Prior to Visit  Medication Dose Route Frequency Provider Last Rate Last Admin   methylPREDNISolone acetate (DEPO-MEDROL) injection 80 mg  80 mg Intramuscular Once Omar Person, MD           Objective:   Physical Exam:  General appearance: 46 y.o., female, NAD, conversant  Eyes: anicteric sclerae; PERRL, tracking appropriately HENT: NCAT; MMM, hoarse Neck: Trachea midline; no lymphadenopathy, no JVD Lungs: wheeze bl some of which clears with cough, with mildly increased respiratory effort CV: RRR, no murmur   Abdomen: Soft, non-tender; non-distended, BS present  Extremities: No peripheral edema, warm Skin: Normal turgor and texture; no rash Psych: Appropriate affect Neuro: Alert and oriented to person and place, no focal deficit     Vitals:   09/13/22 1402  BP: 104/64  Pulse: (!) 124  Temp: 97.9 F (36.6 C)  TempSrc: Oral  SpO2: 100%  Weight: 167 lb 6.4 oz (75.9 kg)  Height: 4\' 11"  (1.499 m)  100% on RA BMI Readings from Last 3 Encounters:  09/13/22 33.81 kg/m  06/04/22 34.13 kg/m  03/20/22 36.36 kg/m   Wt Readings from Last 3 Encounters:  09/13/22 167 lb 6.4 oz (75.9 kg)  06/04/22 169 lb (76.7 kg)  03/20/22 180 lb (81.6 kg)     CBC    Component Value Date/Time   WBC 9.0 03/20/2022 1039   RBC 5.14 (H) 03/20/2022 1039   HGB 10.2 (L) 03/20/2022 1039   HCT 34.1 (L) 03/20/2022 1039   PLT 521.0 (H) 03/20/2022 1039   MCV 66.4 Repeated and verified X2. (L) 03/20/2022 1039   MCH 23.6 (L) 01/10/2021 2025   MCHC 29.8 (L) 03/20/2022 1039   RDW 21.4 (H) 03/20/2022 1039   LYMPHSABS 2.6 03/20/2022 1039   MONOABS 0.8 03/20/2022 1039   EOSABS 0.4 03/20/2022 1039   BASOSABS 0.2 (H) 03/20/2022 1039    Eos 200 IgE 59, aspergillus specific IgE neg  Chest Imaging: CXR 12/16/21 reviewed by me accounting for difference in penetration not significantly changed  HRCT Chest 03/14/22 reviewed by me with enlarged PA, bronchial wall thickening, borderline bronchiolectasis, mild emphysema  Pulmonary Functions Testing Results:    Latest Ref Rng & Units 02/06/2022   11:55 AM  PFT Results  FVC-Pre L 1.88   FVC-Predicted Pre % 73   FVC-Post L 1.91   FVC-Predicted Post % 74   Pre FEV1/FVC % % 48   Post FEV1/FCV % % 49   FEV1-Pre L 0.90   FEV1-Predicted Pre % 42   FEV1-Post L 0.93   DLCO uncorrected ml/min/mmHg 6.03   DLCO UNC% % 32   DLCO corrected ml/min/mmHg 6.03   DLCO COR %Predicted % 32   DLVA Predicted % 37   TLC L 4.36   TLC % Predicted % 97   RV % Predicted % 156    Severe obstruction, air trapping, severely reduced diffusing capacity     Assessment & Plan:   # 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 stay on chantix and would start also using nicotine patch daily - we'll see if we can get you dupixent for severe persistent asthma, eosinophilic asthma - Breztri 2 puffs twice daily with spacer rinse mouth and spacer after use - flutter valve 10 slow but firm puffs twice daily  - flonase 1 spray each nostril after clearing your nose out of crusting following a shower - neti pot rinse with distilled water or boiled then cooled water and salt pack - see instructions. This is for sinus congestion, crusting, and post-nasal drainage - can add zyrtec or claritin daily over the counter if allergy real bad  RTC 6 weeks    Omar Person, MD [facility] 09/13/2022 3:07 PM

## 2022-09-19 ENCOUNTER — Telehealth: Payer: Self-pay | Admitting: Pharmacist

## 2022-09-19 ENCOUNTER — Other Ambulatory Visit (HOSPITAL_COMMUNITY): Payer: Self-pay

## 2022-09-19 DIAGNOSIS — J4551 Severe persistent asthma with (acute) exacerbation: Secondary | ICD-10-CM

## 2022-09-19 NOTE — Telephone Encounter (Signed)
Received new start paperwork for Dupixent. Patent has [employer]. Submitted a Prior Authorization request to  [employer]  for DUPIXENT via fax on CoverMyMeds. Will update once we receive a response.  Key: [id number]  Placed DMW forms in PAP pending info folder in pharmacy office.  Chesley Mires, PharmD, MPH, BCPS, CPP Clinical Pharmacist (Rheumatology and Pulmonology)

## 2022-09-20 ENCOUNTER — Other Ambulatory Visit (HOSPITAL_COMMUNITY): Payer: Self-pay

## 2022-09-20 MED ORDER — DUPIXENT 300 MG/2ML ~~LOC~~ SOAJ
SUBCUTANEOUS | 0 refills | Status: DC
  Filled 2022-09-20: qty 4, 14d supply, fill #0

## 2022-09-20 NOTE — Telephone Encounter (Signed)
Received notification from  [facility]  regarding a prior authorization for DUPIXENT. Authorization has been APPROVED from 09/19/22 to 09/30/22. Approval letter sent to scan center. (4 per 14 days for loading dose)  Approval from 10/01/22 to 03/21/2023 (maintenance dose of 12 ml per 84 days)  Patient can fill through [facility]: [phone]   Rx sent to [facility] in anticipation of scheduling new start visit.  Chesley Mires, PharmD, MPH, BCPS, CPP Clinical Pharmacist (Rheumatology and Pulmonology)

## 2022-09-23 ENCOUNTER — Other Ambulatory Visit: Payer: Self-pay

## 2022-09-23 ENCOUNTER — Other Ambulatory Visit (HOSPITAL_COMMUNITY): Payer: Self-pay

## 2022-09-24 ENCOUNTER — Other Ambulatory Visit (HOSPITAL_COMMUNITY): Payer: Self-pay

## 2022-09-24 NOTE — Telephone Encounter (Signed)
Patient scheduled for Dupixent new start appt on 09/30/2022. Rx will be shipped from [facility], PharmD, MPH, BCPS, CPP Clinical Pharmacist (Rheumatology and Pulmonology)

## 2022-09-25 ENCOUNTER — Other Ambulatory Visit (HOSPITAL_COMMUNITY): Payer: Self-pay

## 2022-09-30 ENCOUNTER — Ambulatory Visit: Payer: Medicaid Other | Admitting: Pharmacist

## 2022-09-30 ENCOUNTER — Other Ambulatory Visit (HOSPITAL_COMMUNITY): Payer: Self-pay

## 2022-09-30 DIAGNOSIS — J4551 Severe persistent asthma with (acute) exacerbation: Secondary | ICD-10-CM

## 2022-09-30 DIAGNOSIS — Z7189 Other specified counseling: Secondary | ICD-10-CM

## 2022-09-30 MED ORDER — DUPIXENT 300 MG/2ML ~~LOC~~ SOAJ
300.0000 mg | SUBCUTANEOUS | 1 refills | Status: DC
  Filled 2022-09-30: qty 12, 84d supply, fill #0
  Filled 2022-10-04: qty 4, 28d supply, fill #0
  Filled 2022-10-28 (×2): qty 4, 28d supply, fill #1
  Filled 2022-11-15: qty 4, 28d supply, fill #2
  Filled 2022-12-17: qty 4, 28d supply, fill #3
  Filled 2023-01-07: qty 4, 28d supply, fill #4
  Filled 2023-02-05: qty 4, 28d supply, fill #5

## 2022-09-30 NOTE — Patient Instructions (Signed)
Your next DUPIXENT dose is due on 10/14/2022, 10/28/2022, and every 14 days thereafter  CONTINUE Breztri 160-9-4.8 mcg (2 puffs twice daily) and montelukast 10mg  nightly  Your prescription will be shipped from [facility]. Their phone number is [phone] Please call to schedule shipment and confirm address. They will mail your medication to your home.  You will need to be seen by your provider in 3 to 4 months to assess how DUPIXENT is working for you. Please ensure you have a follow-up appointment scheduled in March or April 2024. Call our clinic if you need to make this appointment.  How to manage an injection site reaction: Remember the 5 C's: COUNTER - leave on the counter at least 30 minutes but up to overnight to bring medication to room temperature. This may help prevent stinging COLD - place something cold (like an ice gel pack or cold water bottle) on the injection site just before cleansing with alcohol. This may help reduce pain CLARITIN - use Claritin (generic name is loratadine) for the first two weeks of treatment or the day of, the day before, and the day after injecting. This will help to minimize injection site reactions CORTISONE CREAM - apply if injection site is irritated and itching CALL ME - if injection site reaction is bigger than the size of your fist, looks infected, blisters, or if you develop hives

## 2022-09-30 NOTE — Progress Notes (Signed)
HPI Patient presents today to [facility] with her sister to see pharmacy team for Dupixent new start for severe persistent asthma.  Patient reports history of atopic dermatitis though I do not see it per chart review. Patient is nervous to start Dupixent. Her sister who is joining at the visit today used to be medication administration tech and feels comfortable with injectable. Patient anticipates that her sister will be administering Dupixent at home (they do live together)  She is still working on smoking cessation. Down from half pack per day to one cigarette a day. She reports smoking when she is irritated but tries to [facility] on something in these situations.   Respiratory Medications Current regimen: Breztri 160-9-4.8 mcg (2 puffs twice daily) Patient reports no known adherence challenges  OBJECTIVE Allergies  Allergen Reactions   Keflex [Cephalexin]    Pollen Extract     Outpatient Encounter Medications as of 09/30/2022  Medication Sig Note   albuterol (PROVENTIL HFA) 108 (90 Base) MCG/ACT inhaler Inhale 2 puffs into the lungs every 6 (six) hours as needed for wheezing or shortness of breath.    albuterol (PROVENTIL) (2.5 MG/3ML) 0.083% nebulizer solution Take 3 mLs (2.5 mg total) by nebulization every 6 (six) hours as needed for wheezing or shortness of breath.    ALPRAZolam (XANAX) 1 MG tablet Take 1 mg by mouth 2 (two) times daily.    azithromycin (ZITHROMAX) 250 MG tablet Two tablets on day 1, then 1 tablet daily till gone    Budeson-Glycopyrrol-Formoterol (BREZTRI AEROSPHERE) 160-9-4.8 MCG/ACT AERO Inhale 2 puffs into the lungs in the morning and at bedtime.    diclofenac Sodium (VOLTAREN) 1 % GEL Apply 2-4 g topically daily.    Dupilumab (DUPIXENT) 300 MG/2ML SOPN Inject 600mg  into the skin at Week 0. Courier to pulm: [address], [address], [place] [address] by 09/26/22    famotidine (PEPCID) 20 MG tablet Take 20 mg by mouth daily.    fluticasone (FLONASE) 50  MCG/ACT nasal spray Place 1 spray into both nostrils daily.    levETIRAcetam (KEPPRA) 750 MG tablet Take 1 tablet (750 mg total) by mouth 2 (two) times daily.    meloxicam (MOBIC) 7.5 MG tablet TAKE 1 TABLET (7.5 MG TOTAL) BY MOUTH DAILY.    montelukast (SINGULAIR) 10 MG tablet Take 1 tablet (10 mg total) by mouth at bedtime.    nicotine (NICODERM CQ - DOSED IN MG/24 HR) 7 mg/24hr patch PLACE 1 PATCH (7 MG TOTAL) ONTO THE SKIN DAILY.    omeprazole (PRILOSEC) 20 MG capsule Take 20 mg by mouth daily.    predniSONE (DELTASONE) 10 MG tablet Take 4 tabs by mouth for 3 days, then 3 for 3 days, 2 for 3 days, 1 for 3 days and stop (Patient not taking: Reported on 09/13/2022)    predniSONE (DELTASONE) 10 MG tablet Take 4 tabs by mouth for 3 days, then 3 for 3 days, 2 for 3 days, 1 for 3 days and stop    sertraline (ZOLOFT) 50 MG tablet Take by mouth.    Spacer/Aero-Holding Chambers DEVI 1 each by Does not apply route in the morning and at bedtime. Please use with MDI inhalers    tiZANidine (ZANAFLEX) 4 MG tablet TAKE 1 TABLET (4 MG TOTAL) BY MOUTH EVERY 6 (SIX) HOURS AS NEEDED FOR MUSCLE SPASMS. 01/10/2021: Pt needs a refill   varenicline (CHANTIX CONTINUING MONTH PAK) 1 MG tablet Take 1 tablet (1 mg total) by mouth 2 (two) times  daily.    Facility-Administered Encounter Medications as of 09/30/2022  Medication   methylPREDNISolone acetate (DEPO-MEDROL) injection 80 mg     Immunization History  Administered Date(s) Administered   Influenza,inj,Quad PF,6+ Mos 09/27/2019     PFTs    Latest Ref Rng & Units 02/06/2022   11:55 AM  PFT Results  FVC-Pre L 1.88   FVC-Predicted Pre % 73   FVC-Post L 1.91   FVC-Predicted Post % 74   Pre FEV1/FVC % % 48   Post FEV1/FCV % % 49   FEV1-Pre L 0.90   FEV1-Predicted Pre % 42   FEV1-Post L 0.93   DLCO uncorrected ml/min/mmHg 6.03   DLCO UNC% % 32   DLCO corrected ml/min/mmHg 6.03   DLCO COR %Predicted % 32   DLVA Predicted % 37   TLC L 4.36   TLC %  Predicted % 97   RV % Predicted % 156      Eosinophils Most recent blood eosinophil count was 400 cells/microL taken on 03/20/2022.   IgE: 59 on 03/20/2022   Assessment   Biologics training for dupilumab (Dupixent)  Goals of therapy: Mechanism: human monoclonal IgG4 antibody that inhibits interleukin-4 and interleukin-13 cytokine-induced responses, including release of proinflammatory cytokines, chemokines, and IgE Reviewed that Dupixent is add-on medication and patient must continue maintenance inhaler regimen. Response to therapy: may take 4 months to determine efficacy. Discussed that patients generally feel improvement sooner than 4 months.  Side effects: injection site reaction (6-18%), antibody development (5-16%), ophthalmic conjunctivitis (2-16%), transient blood eosinophilia (1-2%)  Dose: 600mg  at Week 0 (administered today in clinic) followed by 300mg  every 14 days thereafter  Administration/Storage:  Reviewed administration sites of thigh or abdomen (at least 2-3 inches away from abdomen). Reviewed the upper arm is only appropriate if caregiver is administering injection  Do not shake pen/syringe as this could lead to product foaming or precipitation. Do not use if solution is discolored or contains particulate matter or if window on prefilled pen is yellow (indicates pen has been used).  Reviewed storage of medication in refrigerator. Reviewed that [place] can be stored at room temperature in unopened carton for up to 14 days.  Access: Approval of Dupixent through: insurance  Patient's sister administered Dupixent 300mg /26ml x 2 (total dose 600mg ) in right upper arm and left upper arm of patient using [facility]-supplied medication Dupixent 300mg /92mL autoinjector pen NDC: [phone] Lot: [id number] Expiration: 07/13/2024  Patient monitored for 30 minutes for adverse reaction.  Patient tolerated without issue. Reported that injection in right arm was slightly more painful but  both tolerable. Injection site checked and no redness or swelling noted by provider. Patient denies itchiness or irritation at injection site.  Medication Reconciliation  A drug regimen assessment was performed, including review of allergies, interactions, disease-state management, dosing and immunization history. Medications were reviewed with the patient, including name, instructions, indication, goals of therapy, potential side effects, importance of adherence, and safe use.  Drug interaction(s): none noted   PLAN Continue Dupixent 300mg  every 14 days.  Next dose is due 10/14/2022 and every 14 days thereafter. Rx sent to: [facility]: [phone] .  Patient provided with pharmacy phone number and advised that they will call her to schedule shipment to home.  Her sister will be administering Dupixent at home to patient. They live together so do not anticipate too many issues with coordinating shipments Continue maintenance asthma regimen of: Breztri 160-9-4.8 mcg (2 puffs twice daily), montelukast 10mg  nightly Continue  setting goals towards smoking cessations. Congratulations on progress thus far!  All questions encouraged and answered.  Instructed patient to reach out with any further questions or concerns.  Thank you for allowing pharmacy to participate in this patient's care.  This appointment required 45 minutes of patient care (this includes precharting, chart review, review of results, face-to-face care, etc.).   Chesley Mires, PharmD, MPH, BCPS, CPP Clinical Pharmacist (Rheumatology and Pulmonology)

## 2022-10-04 ENCOUNTER — Other Ambulatory Visit (HOSPITAL_COMMUNITY): Payer: Self-pay

## 2022-10-04 ENCOUNTER — Other Ambulatory Visit: Payer: Self-pay

## 2022-10-08 ENCOUNTER — Other Ambulatory Visit: Payer: Self-pay

## 2022-10-15 ENCOUNTER — Other Ambulatory Visit: Payer: Self-pay | Admitting: Student

## 2022-10-28 ENCOUNTER — Other Ambulatory Visit (HOSPITAL_COMMUNITY): Payer: Self-pay

## 2022-10-29 ENCOUNTER — Other Ambulatory Visit (HOSPITAL_COMMUNITY): Payer: Self-pay

## 2022-11-15 ENCOUNTER — Other Ambulatory Visit (HOSPITAL_COMMUNITY): Payer: Self-pay

## 2022-11-20 ENCOUNTER — Other Ambulatory Visit: Payer: Self-pay

## 2022-11-26 ENCOUNTER — Other Ambulatory Visit: Payer: Self-pay | Admitting: Student

## 2022-12-17 ENCOUNTER — Other Ambulatory Visit (HOSPITAL_COMMUNITY): Payer: Self-pay

## 2022-12-31 ENCOUNTER — Ambulatory Visit (HOSPITAL_BASED_OUTPATIENT_CLINIC_OR_DEPARTMENT_OTHER): Payer: Medicaid Other | Attending: Student | Admitting: Internal Medicine

## 2022-12-31 VITALS — Ht 59.5 in | Wt 173.0 lb

## 2022-12-31 DIAGNOSIS — R0683 Snoring: Secondary | ICD-10-CM | POA: Diagnosis present

## 2022-12-31 DIAGNOSIS — R0902 Hypoxemia: Secondary | ICD-10-CM | POA: Diagnosis not present

## 2022-12-31 DIAGNOSIS — G4761 Periodic limb movement disorder: Secondary | ICD-10-CM | POA: Diagnosis not present

## 2023-01-04 DIAGNOSIS — R0683 Snoring: Secondary | ICD-10-CM

## 2023-01-04 NOTE — Procedures (Signed)
Patient Name: Carollynn, Cassler Date: 12/31/2022 Gender: Female D.O.B: [birth year] Age (years): 46 Referring Provider: Maryjane Hurter Height (inches): 60 Interpreting Physician: Baird Lyons MD, ABSM Weight (lbs): 173 RPSGT: Jorge Ny BMI: 34 MRN: [id number] Neck Size: 14.00  CLINICAL INFORMATION Sleep Study Type: NPSG Indication for sleep study: COPD, Snoring Epworth Sleepiness Score: 13  SLEEP STUDY TECHNIQUE As per the AASM Manual for the Scoring of Sleep and Associated Events v2.3 (April 2016) with a hypopnea requiring 4% desaturations.  The channels recorded and monitored were frontal, central and occipital EEG, electrooculogram (EOG), submentalis EMG (chin), nasal and oral airflow, thoracic and abdominal wall motion, anterior tibialis EMG, snore microphone, electrocardiogram, and pulse oximetry.  MEDICATIONS Medications self-administered by patient taken the night of the study : ALBUTEROL, [place] The study was initiated at 10:51:16 PM and ended at 4:55:44 AM.  Sleep onset time was 15.1 minutes and the sleep efficiency was 33.2%. The total sleep time was 121 minutes.  Stage REM latency was N/A minutes.  The patient spent 28.5% of the night in stage N1 sleep, 71.5% in stage N2 sleep, 0.0% in stage N3 and 0% in REM.  Alpha intrusion was absent.  Supine sleep was 0.00%.  RESPIRATORY PARAMETERS The overall apnea/hypopnea index (AHI) was 0.5 per hour. There were 0 total apneas, including 0 obstructive, 0 central and 0 mixed apneas. There were 1 hypopneas and 0 RERAs.  The AHI during Stage REM sleep was N/A per hour.  AHI while supine was N/A per hour.  The mean oxygen saturation was 93.6%. The minimum SpO2 during sleep was 83.0%.  soft snoring was noted during this study.  CARDIAC DATA The 2 lead EKG demonstrated sinus rhythm. The mean heart rate was 87.2 beats per minute. Other EKG findings include: None.  LEG MOVEMENT  DATA The total PLMS were 0 with a resulting PLMS index of 0.0. Associated arousal with leg movement index was 33.2 .  IMPRESSIONS - Difficulty initiating and maintaining sleep with frequent arousal due to cough and limb movement. Total sleep time 121 minutes, sleep efficiency 33.2%. REM was absent. - No significant obstructive sleep apnea occurred during this study (AHI = 0.5/h). - Oxygen desaturation was noted during this study (Min O2 = 82.0%). Supplemental O2 1 L was added by tech per protocol at 11:45 PM due to sustained low saturation. Subsequent O2 saturation 95-96% on O2 1L. - Repeated coughing noted to disturb sleep. Albuterol inhaler used at least once during night. - The patient snored with soft snoring volume. - Mild tachycardia, HR 95-99 bpm, Mean HR 87/ min. - Limb movement sleep disturbance- total limb movements 351 (174/ hr). Limb movement with arousal;/ awakening 67 (33.2/ hr).  DIAGNOSIS - Nocturnal Hypoxemia (G47.36) - Periodic Limb Movement with Arousal  RECOMMENDATIONS - Consider trial of Mirapex, Requip, or Sinemet for treatment of Periodic Leg Movements of Sleep. - Consider supplemental O2 during sleep. - Sleep hygiene should be reviewed to assess factors that may improve sleep quality. - Weight management and regular exercise should be initiated or continued if appropriate.  [Electronically signed] 01/04/2023 12:34 PM  Baird Lyons MD, [place], American Board of Sleep Medicine NPI: [id number]                        [place]  ELECTRONICALLY SIGNED ON:  01/04/2023, 12:22 PM [place] PH: [phone]   FX: [phone] [id number] ACCREDITED BY THE AMERICAN ACADEMY OF SLEEP MEDICINE

## 2023-01-07 ENCOUNTER — Other Ambulatory Visit (HOSPITAL_COMMUNITY): Payer: Self-pay

## 2023-01-14 ENCOUNTER — Other Ambulatory Visit: Payer: Self-pay

## 2023-01-27 NOTE — Progress Notes (Deleted)
Synopsis: Referred for asthma by Norm Salt, PA  Subjective:   PATIENT ID: Amber Clarke GENDER: female DOB: 30-May-1976, MRN: [id number]  No chief complaint on file.  47yF with history of asthma, seizures. Had covid in 02/2021 wasn't severe. She is not vaccinated for covid-19.  She has been on breo since December taking 1 puff twice daily, not rinsing mouth afterward.   She has a very bad cough. Productive cough. Has been bad since Nov of last year. She does have sinus congestion, nasal drainage. She is on singulair has been on it for a while. She says it burns when she tried using flonase in past. SHe takes omeprazole before dinner.   She has DOE to half block maybe. Gradually worsening but breo has helped some.  Inhalers tried: Albuterol Advair diskus no problems Dulera made her vision blurry Virgel Bouquet likes it  COPD in mother and father.   She is from [place]. Moved to [place] 3 ya. Smokes half ppd. 2 blunts MJ. No vaping. No pets.   Interval HPI:  She says that she 'can't breathe.' Worsening over last week, especially over last 4 days. She initially attributed to change in climate getting back from [place]. No fever. She does have cough -clearish. She hasn't had recent course of prednisone - no ED/urgent care visits or hospitalizations since last visit with our clinic.   ------------------------ Last visit treated for asthma-copd exacerbation  Wheeze, cough, sore throat over last week. No fever. Down to 1 cigarette/day, uses nicotine patches. Coughing up yellowish/thick sputum.  0------------------------- Last visit strongly encouraged smoking cessation,  started on dupixent  PSG with poor sleep efficiency, PLMS. 6 min or so of saturation <88%.  Otherwise pertinent review of systems is negative.    Past Medical History:  Diagnosis Date   Asthma    Seizures (HCC)      Family History  Problem Relation Age of Onset   Hypertension Mother    Diabetes Mother    COPD  Mother    Asthma Mother    Hypertension Father    Asthma Father    Heart disease Sister    Heart disease Maternal Grandmother      Past Surgical History:  Procedure Laterality Date   CHOLECYSTECTOMY     TUBAL LIGATION      Social History   Socioeconomic History   Marital status: Single    Spouse name: Not on file   Number of children: Not on file   Years of education: Not on file   Highest education level: Not on file  Occupational History   Not on file  Tobacco Use   Smoking status: Some Days    Packs/day: 0.25    Years: 28.00    Additional pack years: 0.00    Total pack years: 7.00    Types: Cigarettes   Smokeless tobacco: Never   Tobacco comments:    Pt states she is down to 1 cigarette a day   Vaping Use   Vaping Use: Never used  Substance and Sexual Activity   Alcohol use: Not Currently   Drug use: Not Currently   Sexual activity: Yes    Birth control/protection: None  Other Topics Concern   Not on file  Social History Narrative   Not on file   Social Determinants of Health   Financial Resource Strain: Not on file  Food Insecurity: Not on file  Transportation Needs: Not on file  Physical Activity: Not on file  Stress: Not  on file  Social Connections: Not on file  Intimate Partner Violence: Not on file     Allergies  Allergen Reactions   Keflex [Cephalexin]    Pollen Extract      Outpatient Medications Prior to Visit  Medication Sig Dispense Refill   albuterol (PROVENTIL HFA) 108 (90 Base) MCG/ACT inhaler Inhale 2 puffs into the lungs every 6 (six) hours as needed for wheezing or shortness of breath. 18 g 6   albuterol (PROVENTIL) (2.5 MG/3ML) 0.083% nebulizer solution Take 3 mLs (2.5 mg total) by nebulization every 6 (six) hours as needed for wheezing or shortness of breath. 75 mL 12   ALPRAZolam (XANAX) 1 MG tablet Take 1 mg by mouth 2 (two) times daily.     azithromycin (ZITHROMAX) 250 MG tablet Two tablets on day 1, then 1 tablet daily till  gone 6 tablet 0   Budeson-Glycopyrrol-Formoterol (BREZTRI AEROSPHERE) 160-9-4.8 MCG/ACT AERO Inhale 2 puffs into the lungs in the morning and at bedtime. 10.7 g 11   diclofenac Sodium (VOLTAREN) 1 % GEL Apply 2-4 g topically daily.     Dupilumab (DUPIXENT) 300 MG/2ML SOPN Inject 300 mg into the skin every 14 (fourteen) days. 12 mL 1   famotidine (PEPCID) 20 MG tablet Take 20 mg by mouth daily.     fluticasone (FLONASE) 50 MCG/ACT nasal spray Place 1 spray into both nostrils daily. 16 g 11   levETIRAcetam (KEPPRA) 750 MG tablet Take 1 tablet (750 mg total) by mouth 2 (two) times daily. 60 tablet 6   meloxicam (MOBIC) 7.5 MG tablet TAKE 1 TABLET (7.5 MG TOTAL) BY MOUTH DAILY. 30 tablet 0   montelukast (SINGULAIR) 10 MG tablet Take 1 tablet (10 mg total) by mouth at bedtime. 30 tablet 6   nicotine (NICODERM CQ - DOSED IN MG/24 HR) 7 mg/24hr patch PLACE 1 PATCH (7 MG TOTAL) ONTO THE SKIN DAILY. 30 patch 11   omeprazole (PRILOSEC) 20 MG capsule Take 20 mg by mouth daily.     predniSONE (DELTASONE) 10 MG tablet Take 4 tabs by mouth for 3 days, then 3 for 3 days, 2 for 3 days, 1 for 3 days and stop 30 tablet 0   sertraline (ZOLOFT) 50 MG tablet Take by mouth.     Spacer/Aero-Holding Chambers DEVI 1 each by Does not apply route in the morning and at bedtime. Please use with MDI inhalers 1 each 0   tiZANidine (ZANAFLEX) 4 MG tablet TAKE 1 TABLET (4 MG TOTAL) BY MOUTH EVERY 6 (SIX) HOURS AS NEEDED FOR MUSCLE SPASMS. 60 tablet 2   varenicline (CHANTIX) 1 MG tablet TAKE 1 TABLET (1 MG TOTAL) BY MOUTH 2 (TWO) TIMES DAILY. 60 tablet 1   Facility-Administered Medications Prior to Visit  Medication Dose Route Frequency Provider Last Rate Last Admin   methylPREDNISolone acetate (DEPO-MEDROL) injection 80 mg  80 mg Intramuscular Once Omar Person, MD           Objective:   Physical Exam:  General appearance: 47 y.o., female, NAD, conversant  Eyes: anicteric sclerae; PERRL, tracking  appropriately HENT: NCAT; MMM, hoarse Neck: Trachea midline; no lymphadenopathy, no JVD Lungs: wheeze bl some of which clears with cough, with mildly increased respiratory effort CV: RRR, no murmur  Abdomen: Soft, non-tender; non-distended, BS present  Extremities: No peripheral edema, warm Skin: Normal turgor and texture; no rash Psych: Appropriate affect Neuro: Alert and oriented to person and place, no focal deficit     There were no vitals filed  for this visit.       on RA BMI Readings from Last 3 Encounters:  12/31/22 34.36 kg/m  09/13/22 33.81 kg/m  06/04/22 34.13 kg/m   Wt Readings from Last 3 Encounters:  12/31/22 173 lb (78.5 kg)  09/13/22 167 lb 6.4 oz (75.9 kg)  06/04/22 169 lb (76.7 kg)     CBC    Component Value Date/Time   WBC 9.0 03/20/2022 1039   RBC 5.14 (H) 03/20/2022 1039   HGB 10.2 (L) 03/20/2022 1039   HCT 34.1 (L) 03/20/2022 1039   PLT 521.0 (H) 03/20/2022 1039   MCV 66.4 Repeated and verified X2. (L) 03/20/2022 1039   MCH 23.6 (L) 01/10/2021 2025   MCHC 29.8 (L) 03/20/2022 1039   RDW 21.4 (H) 03/20/2022 1039   LYMPHSABS 2.6 03/20/2022 1039   MONOABS 0.8 03/20/2022 1039   EOSABS 0.4 03/20/2022 1039   BASOSABS 0.2 (H) 03/20/2022 1039    Eos 200 IgE 59, aspergillus specific IgE neg  Chest Imaging: CXR 12/16/21 reviewed by me accounting for difference in penetration not significantly changed  HRCT Chest 03/14/22 reviewed by me with enlarged PA, bronchial wall thickening, borderline bronchiolectasis, mild emphysema  Pulmonary Functions Testing Results:    Latest Ref Rng & Units 02/06/2022   11:55 AM  PFT Results  FVC-Pre L 1.88   FVC-Predicted Pre % 73   FVC-Post L 1.91   FVC-Predicted Post % 74   Pre FEV1/FVC % % 48   Post FEV1/FCV % % 49   FEV1-Pre L 0.90   FEV1-Predicted Pre % 42   FEV1-Post L 0.93   DLCO uncorrected ml/min/mmHg 6.03   DLCO UNC% % 32   DLCO corrected ml/min/mmHg 6.03   DLCO COR %Predicted % 32   DLVA  Predicted % 37   TLC L 4.36   TLC % Predicted % 97   RV % Predicted % 156   Severe obstruction, air trapping, severely reduced diffusing capacity     Assessment & Plan:   # 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 stay on chantix and would start also using nicotine patch daily - we'll see if we can get you dupixent for severe persistent asthma, eosinophilic asthma - Breztri 2 puffs twice daily with spacer rinse mouth and spacer after use - flutter valve 10 slow but firm puffs twice daily  - flonase 1 spray each nostril after clearing your nose out of crusting following a shower - neti pot rinse with distilled water or boiled then cooled water and salt pack - see instructions. This is for sinus congestion, crusting, and post-nasal drainage - can add zyrtec or claritin daily over the counter if allergy real bad  RTC 6 weeks    Omar Person, MD [facility] 01/27/2023 5:06 PM

## 2023-01-28 ENCOUNTER — Other Ambulatory Visit: Payer: Self-pay | Admitting: Student

## 2023-01-29 ENCOUNTER — Ambulatory Visit: Payer: Medicaid Other | Admitting: Student

## 2023-01-30 ENCOUNTER — Other Ambulatory Visit: Payer: Self-pay | Admitting: Physician Assistant

## 2023-01-30 DIAGNOSIS — Z1231 Encounter for screening mammogram for malignant neoplasm of breast: Secondary | ICD-10-CM

## 2023-02-05 ENCOUNTER — Encounter: Payer: Self-pay | Admitting: Student

## 2023-02-05 ENCOUNTER — Other Ambulatory Visit (HOSPITAL_COMMUNITY): Payer: Self-pay

## 2023-02-07 ENCOUNTER — Encounter (HOSPITAL_COMMUNITY): Payer: Self-pay

## 2023-02-07 ENCOUNTER — Ambulatory Visit (HOSPITAL_COMMUNITY)
Admission: EM | Admit: 2023-02-07 | Discharge: 2023-02-07 | Disposition: A | Discharge status: Home / Self Care | Payer: Medicaid Other | Attending: Emergency Medicine | Admitting: Emergency Medicine

## 2023-02-07 DIAGNOSIS — B370 Candidal stomatitis: Secondary | ICD-10-CM

## 2023-02-07 DIAGNOSIS — J45901 Unspecified asthma with (acute) exacerbation: Secondary | ICD-10-CM

## 2023-02-07 MED ORDER — NYSTATIN 100000 UNIT/ML MT SUSP: 500000.0000 [IU] | Freq: Four times a day (QID) | OROMUCOSAL | 0 refills | Status: DC

## 2023-02-07 MED ORDER — FLUCONAZOLE 200 MG PO TABS: 200.0000 mg | ORAL_TABLET | Freq: Every day | ORAL | 0 refills | Status: AC

## 2023-02-07 NOTE — ED Triage Notes (Signed)
Pt states she has a mass in her throat x 3 days. No other symptoms.

## 2023-02-07 NOTE — ED Provider Notes (Signed)
[facility]    CSN: [id number] Arrival date & time: 02/07/23  1752      History   Chief Complaint Chief Complaint  Patient presents with   Mass    HPI Amber Clarke is a 47 y.o. female.   Reports she feels like something is blocking her airway especially when she lays down.  Reports this has happened previously and she was diagnosed with oral thrush.  She does use inhalers for her asthma regularly.  Patient endorses shortness of breath, she is always short of breath with her asthma.  She also reports a cough, this is due to allergies.  She has been able to eat and drink.  Denies fevers.  Denies abdominal pain, nausea, vomiting or diarrhea.  Denies chest pain.  The history is provided by the patient and medical records.    Past Medical History:  Diagnosis Date   Asthma    Seizures [facility])     Patient Active Problem List   Diagnosis Date Noted   Snoring 12/31/2022   Asthma    Seizures (HCC)     Past Surgical History:  Procedure Laterality Date   CHOLECYSTECTOMY     TUBAL LIGATION      OB History   No obstetric history on file.      Home Medications    Prior to Admission medications   Medication Sig Start Date End Date Taking? Authorizing Provider  albuterol (PROVENTIL HFA) 108 (90 Base) MCG/ACT inhaler Inhale 2 puffs into the lungs every 6 (six) hours as needed for wheezing or shortness of breath. 12/28/19  Yes Hoy Register, MD  albuterol (PROVENTIL) (2.5 MG/3ML) 0.083% nebulizer solution Take 3 mLs (2.5 mg total) by nebulization every 6 (six) hours as needed for wheezing or shortness of breath. 02/06/22  Yes Omar Person, MD  ALPRAZolam Prudy Feeler) 1 MG tablet Take 1 mg by mouth 2 (two) times daily. 03/14/22  Yes [provider]  Budeson-Glycopyrrol-Formoterol (BREZTRI AEROSPHERE) 160-9-4.8 MCG/ACT AERO INHALE 2 PUFFS INTO THE LUNGS IN THE MORNING AND AT BEDTIME. 01/29/23  Yes Omar Person, MD  diclofenac Sodium (VOLTAREN) 1 % GEL Apply  2-4 g topically daily. 12/21/20  Yes [provider]  Dupilumab (DUPIXENT) 300 MG/2ML SOPN Inject 300 mg into the skin every 14 (fourteen) days. 09/30/22  Yes Omar Person, MD  famotidine (PEPCID) 20 MG tablet Take 20 mg by mouth daily. 12/26/20  Yes [provider]  fluconazole (DIFLUCAN) 200 MG tablet Take 1 tablet (200 mg total) by mouth daily for 7 days. 200 mg on day 1 then 100 mg 02/07/23 02/14/23 Yes Rinaldo Ratel, [place] N, FNP  levETIRAcetam (KEPPRA) 750 MG tablet Take 1 tablet (750 mg total) by mouth 2 (two) times daily. 12/28/19  Yes Newlin, Odette Horns, MD  meloxicam (MOBIC) 7.5 MG tablet TAKE 1 TABLET (7.5 MG TOTAL) BY MOUTH DAILY. 08/23/20  Yes Newlin, Odette Horns, MD  montelukast (SINGULAIR) 10 MG tablet Take 1 tablet (10 mg total) by mouth at bedtime. 09/13/22  Yes Omar Person, MD  nystatin (MYCOSTATIN) 100000 UNIT/ML suspension Take 5 mLs (500,000 Units total) by mouth 4 (four) times daily. 02/07/23  Yes Rinaldo Ratel, [place] N, FNP  azithromycin (ZITHROMAX) 250 MG tablet Two tablets on day 1, then 1 tablet daily till gone 09/13/22   Omar Person, MD  fluticasone (FLONASE) 50 MCG/ACT nasal spray PLACE 1 SPRAY INTO BOTH NOSTRILS DAILY. 01/29/23   Omar Person, MD  nicotine (NICODERM CQ - DOSED IN MG/24 HR)  7 mg/24hr patch PLACE 1 PATCH (7 MG TOTAL) ONTO THE SKIN DAILY. 07/16/22   Omar Person, MD  omeprazole (PRILOSEC) 20 MG capsule Take 20 mg by mouth daily. 12/26/20   [provider]  predniSONE (DELTASONE) 10 MG tablet Take 4 tabs by mouth for 3 days, then 3 for 3 days, 2 for 3 days, 1 for 3 days and stop 09/13/22   Omar Person, MD  sertraline (ZOLOFT) 50 MG tablet Take by mouth. 03/14/22   [provider]  Spacer/Aero-Holding Deretha Emory DEVI 1 each by Does not apply route in the morning and at bedtime. Please use with MDI inhalers 12/14/21   Omar Person, MD  tiZANidine (ZANAFLEX) 4 MG tablet TAKE 1 TABLET (4 MG TOTAL) BY MOUTH EVERY 6  (SIX) HOURS AS NEEDED FOR MUSCLE SPASMS. 04/20/20   Hoy Register, MD  varenicline (CHANTIX) 1 MG tablet TAKE 1 TABLET (1 MG TOTAL) BY MOUTH 2 (TWO) TIMES DAILY. 11/27/22   Omar Person, MD    Family History Family History  Problem Relation Age of Onset   Hypertension Mother    Diabetes Mother    COPD Mother    Asthma Mother    Hypertension Father    Asthma Father    Heart disease Sister    Heart disease Maternal Grandmother     Social History Social History   Tobacco Use   Smoking status: Some Days    Packs/day: 0.25    Years: 28.00    Additional pack years: 0.00    Total pack years: 7.00    Types: Cigarettes   Smokeless tobacco: Never   Tobacco comments:    Pt states she is down to 1 cigarette a day   Vaping Use   Vaping Use: Never used  Substance Use Topics   Alcohol use: Not Currently   Drug use: Not Currently     Allergies   Keflex [cephalexin] and Pollen extract   Review of Systems Review of Systems  Constitutional:  Negative for chills, fatigue and fever.  HENT:  Positive for trouble swallowing.   Respiratory:  Positive for cough, shortness of breath and wheezing.   Cardiovascular:  Negative for chest pain.  Gastrointestinal:  Negative for abdominal pain, diarrhea, nausea and vomiting.     Physical Exam Triage Vital Signs ED Triage Vitals [02/07/23 1839]  Enc Vitals Group     BP 134/77     Pulse Rate (!) 113     Resp 16     Temp 98.3 F (36.8 C)     Temp Source Oral     SpO2 98 %     Weight      Height      Head Circumference      Peak Flow      Pain Score      Pain Loc      Pain Edu?      Excl. in GC?    No data found.  Updated Vital Signs BP 134/77 (BP Location: Left Arm)   Pulse (!) 113   Temp 98.3 F (36.8 C) (Oral)   Resp 16   SpO2 98%   Visual Acuity Right Eye Distance:   Left Eye Distance:   Bilateral Distance:    Right Eye Near:   Left Eye Near:    Bilateral Near:     Physical Exam Vitals and nursing note  reviewed.  Constitutional:      Appearance: Normal appearance.  HENT:  Head: Normocephalic and atraumatic.     Right Ear: External ear normal.     Left Ear: External ear normal.     Nose: Congestion and rhinorrhea present.     Mouth/Throat:     Mouth: Mucous membranes are moist.     Tongue: Lesions present.     Comments: Diffuse white patches on hard and soft palate as well as her tongue. Cardiovascular:     Rate and Rhythm: Normal rate and regular rhythm.     Heart sounds: Normal heart sounds. No murmur heard. Pulmonary:     Effort: Pulmonary effort is normal.     Breath sounds: Wheezing present.  Skin:    General: Skin is warm and dry.  Neurological:     General: No focal deficit present.     Mental Status: She is alert and oriented to person, place, and time.  Psychiatric:        Mood and Affect: Mood normal.        Behavior: Behavior normal. Behavior is cooperative.      UC Treatments / Results  Labs (all labs ordered are listed, but only abnormal results are displayed) Labs Reviewed - No data to display  EKG   Radiology No results found.  Procedures Procedures (including critical care time)  Medications Ordered in UC Medications - No data to display  Initial Impression / Assessment and Plan / UC Course  I have reviewed the triage vital signs and the nursing notes.  Pertinent labs & imaging results that were available during my care of the patient were reviewed by me and considered in my medical decision making (see chart for details).   Vitals and triage reviewed, patient is hemodynamically stable.  No obvious obstruction in her airway, able to eat and drink.  Lungs with expiratory wheezing, patient reports this is her baseline with her asthma.  Does have frequent use of inhalers, has had previous oral thrush.  Encouraged to rinse mouth out after inhalers.  Patient reports that she has all of her inhalers, does not need refills, reports asthma is at her  baseline.  Provided with oral fluconazole and nystatin rinse.  Encouraged to follow-up with primary care provider if symptoms do not improve over the weekend.  Discussed emergency return precautions, no questions at this time.      Final Clinical Impressions(s) / UC Diagnoses   Final diagnoses:  Oral thrush  Mild asthma exacerbation   Discharge Instructions   None    ED Prescriptions     Medication Sig Dispense Auth. Provider   fluconazole (DIFLUCAN) 200 MG tablet Take 1 tablet (200 mg total) by mouth daily for 7 days. 200 mg on day 1 then 100 mg 7 tablet Rinaldo Ratel, [place] N, [place]   nystatin (MYCOSTATIN) 100000 UNIT/ML suspension Take 5 mLs (500,000 Units total) by mouth 4 (four) times daily. 60 mL Katelin Kutsch, [place] N, [place]      PDMP not reviewed this encounter.   Aleah Ahlgrim, [place] N, [place] 02/07/23 1902

## 2023-02-10 ENCOUNTER — Ambulatory Visit: Payer: Medicaid Other | Admitting: Primary Care

## 2023-02-10 NOTE — Progress Notes (Deleted)
@Patient  ID: Amber Clarke, female    DOB: January 24, 1976, 47 y.o.   MRN: [id number]  No chief complaint on file.   Referring provider: Norm Salt, PA  HPI: 47 year old female, someday smoker.  Past medical history significant for severe persistent asthma, seizures, snoring.  Patient of Dr. Thora Lance, last seen in office on 09/13/2022.    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   Maintained on [employer] and flutter valve Smoking cessation encouraged      Allergies  Allergen Reactions   Keflex [Cephalexin]    Pollen Extract     Immunization History  Administered Date(s) Administered   Influenza,inj,Quad PF,6+ Mos 09/27/2019    Past Medical History:  Diagnosis Date   Asthma    Seizures (HCC)     Tobacco History: Social History   Tobacco Use  Smoking Status Some Days   Packs/day: 0.25   Years: 28.00   Additional pack years: 0.00   Total pack years: 7.00   Types: Cigarettes  Smokeless Tobacco Never  Tobacco Comments   Pt states she is down to 1 cigarette a day    Ready to quit: Not Answered Counseling given: Not Answered Tobacco comments: Pt states she is down to 1 cigarette a day    Outpatient Medications Prior to Visit  Medication Sig Dispense Refill   albuterol (PROVENTIL HFA) 108 (90 Base) MCG/ACT inhaler Inhale 2 puffs into the lungs every 6 (six) hours as needed for wheezing or shortness of breath. 18 g 6   albuterol (PROVENTIL) (2.5 MG/3ML) 0.083% nebulizer solution Take 3 mLs (2.5 mg total) by nebulization every 6 (six) hours as needed for wheezing or shortness of breath. 75 mL 12   ALPRAZolam (XANAX) 1 MG tablet Take 1 mg by mouth 2 (two) times daily.     azithromycin (ZITHROMAX) 250 MG tablet Two tablets on day 1, then 1 tablet daily till gone 6 tablet 0    Budeson-Glycopyrrol-Formoterol (BREZTRI AEROSPHERE) 160-9-4.8 MCG/ACT AERO INHALE 2 PUFFS INTO THE LUNGS IN THE MORNING AND AT BEDTIME. 10.7 g 11   diclofenac Sodium (VOLTAREN) 1 % GEL Apply 2-4 g topically daily.     Dupilumab (DUPIXENT) 300 MG/2ML SOPN Inject 300 mg into the skin every 14 (fourteen) days. 12 mL 1   famotidine (PEPCID) 20 MG tablet Take 20 mg by mouth daily.     fluconazole (DIFLUCAN) 200 MG tablet Take 1 tablet (200 mg total) by mouth daily for 7 days. 200 mg on day 1 then 100 mg 7 tablet 0   fluticasone (FLONASE) 50 MCG/ACT nasal spray PLACE 1 SPRAY INTO BOTH NOSTRILS DAILY. 16 g 11   levETIRAcetam (KEPPRA) 750 MG tablet Take 1 tablet (750 mg total) by mouth 2 (two) times daily. 60 tablet 6   meloxicam (MOBIC) 7.5 MG tablet TAKE 1 TABLET (7.5 MG TOTAL) BY MOUTH DAILY. 30 tablet 0   montelukast (SINGULAIR) 10 MG tablet Take 1 tablet (10 mg total) by mouth at bedtime. 30 tablet 6   nicotine (NICODERM CQ - DOSED IN MG/24 HR) 7 mg/24hr patch PLACE 1 PATCH (7 MG TOTAL) ONTO THE SKIN DAILY. 30 patch 11   nystatin (MYCOSTATIN) 100000 UNIT/ML suspension Take 5 mLs (500,000 Units total) by mouth 4 (four) times daily. 60 mL 0   omeprazole (PRILOSEC) 20 MG capsule Take 20 mg by mouth daily.  predniSONE (DELTASONE) 10 MG tablet Take 4 tabs by mouth for 3 days, then 3 for 3 days, 2 for 3 days, 1 for 3 days and stop 30 tablet 0   sertraline (ZOLOFT) 50 MG tablet Take by mouth.     Spacer/Aero-Holding Chambers DEVI 1 each by Does not apply route in the morning and at bedtime. Please use with MDI inhalers 1 each 0   tiZANidine (ZANAFLEX) 4 MG tablet TAKE 1 TABLET (4 MG TOTAL) BY MOUTH EVERY 6 (SIX) HOURS AS NEEDED FOR MUSCLE SPASMS. 60 tablet 2   varenicline (CHANTIX) 1 MG tablet TAKE 1 TABLET (1 MG TOTAL) BY MOUTH 2 (TWO) TIMES DAILY. 60 tablet 1   Facility-Administered Medications Prior to Visit  Medication Dose Route Frequency Provider Last Rate Last Admin   methylPREDNISolone acetate  (DEPO-MEDROL) injection 80 mg  80 mg Intramuscular Once Omar Person, MD          Review of Systems  Review of Systems   Physical Exam  There were no vitals taken for this visit. Physical Exam   Lab Results:  CBC    Component Value Date/Time   WBC 9.0 03/20/2022 1039   RBC 5.14 (H) 03/20/2022 1039   HGB 10.2 (L) 03/20/2022 1039   HCT 34.1 (L) 03/20/2022 1039   PLT 521.0 (H) 03/20/2022 1039   MCV 66.4 Repeated and verified X2. (L) 03/20/2022 1039   MCH 23.6 (L) 01/10/2021 2025   MCHC 29.8 (L) 03/20/2022 1039   RDW 21.4 (H) 03/20/2022 1039   LYMPHSABS 2.6 03/20/2022 1039   MONOABS 0.8 03/20/2022 1039   EOSABS 0.4 03/20/2022 1039   BASOSABS 0.2 (H) 03/20/2022 1039    BMET    Component Value Date/Time   NA 136 01/10/2021 2025   K 3.9 01/10/2021 2025   CL 106 01/10/2021 2025   CO2 24 01/10/2021 2025   GLUCOSE 115 (H) 01/10/2021 2025   BUN 5 (L) 01/10/2021 2025   CREATININE 0.84 01/10/2021 2025   CALCIUM 9.0 01/10/2021 2025   GFRNONAA >60 01/10/2021 2025    BNP No results found for: "BNP"  ProBNP No results found for: "PROBNP"  Imaging: No results found.   Assessment & Plan:   No problem-specific Assessment & Plan notes found for this encounter.     Glenford Bayley, NP 02/10/2023

## 2023-02-11 ENCOUNTER — Other Ambulatory Visit (HOSPITAL_COMMUNITY): Payer: Self-pay

## 2023-02-28 ENCOUNTER — Encounter: Payer: Self-pay | Admitting: Primary Care

## 2023-03-06 ENCOUNTER — Telehealth: Payer: Self-pay | Admitting: Pharmacist

## 2023-03-06 NOTE — Progress Notes (Deleted)
Synopsis: Referred for asthma by Norm Salt, PA  Subjective:   PATIENT ID: Amber Clarke GENDER: female DOB: 1976/09/10, MRN: [id number]  No chief complaint on file.  45yF with history of asthma, seizures. Had covid in 02/2021 wasn't severe. She is not vaccinated for covid-19.  She has been on breo since December taking 1 puff twice daily, not rinsing mouth afterward.   She has a very bad cough. Productive cough. Has been bad since Nov of last year. She does have sinus congestion, nasal drainage. She is on singulair has been on it for a while. She says it burns when she tried using flonase in past. SHe takes omeprazole before dinner.   She has DOE to half block maybe. Gradually worsening but breo has helped some.  Inhalers tried: Albuterol Advair diskus no problems Dulera made her vision blurry Virgel Bouquet likes it  COPD in mother and father.   She is from [place]. Moved to [place] 3 ya. Smokes half ppd. 2 blunts MJ. No vaping. No pets.   Interval HPI:  She says that she 'can't breathe.' Worsening over last week, especially over last 4 days. She initially attributed to change in climate getting back from [place]. No fever. She does have cough -clearish. She hasn't had recent course of prednisone - no ED/urgent care visits or hospitalizations since last visit with our clinic.   ------------------------ Last visit treated for asthma-copd exacerbation  Wheeze, cough, sore throat over last week. No fever. Down to 1 cigarette/day, uses nicotine patches. Coughing up yellowish/thick sputum.  ------------------------------------------ Treated for exacerbation asthma-copd overlap last visit, started airway clearance regimen, working on smoking cessation, started on dupixent  Otherwise pertinent review of systems is negative.    Past Medical History:  Diagnosis Date   Asthma    Seizures (HCC)      Family History  Problem Relation Age of Onset   Hypertension Mother    Diabetes Mother     COPD Mother    Asthma Mother    Hypertension Father    Asthma Father    Heart disease Sister    Heart disease Maternal Grandmother      Past Surgical History:  Procedure Laterality Date   CHOLECYSTECTOMY     TUBAL LIGATION      Social History   Socioeconomic History   Marital status: Single    Spouse name: Not on file   Number of children: Not on file   Years of education: Not on file   Highest education level: Not on file  Occupational History   Not on file  Tobacco Use   Smoking status: Some Days    Packs/day: 0.25    Years: 28.00    Additional pack years: 0.00    Total pack years: 7.00    Types: Cigarettes   Smokeless tobacco: Never   Tobacco comments:    Pt states she is down to 1 cigarette a day   Vaping Use   Vaping Use: Never used  Substance and Sexual Activity   Alcohol use: Not Currently   Drug use: Not Currently   Sexual activity: Yes    Birth control/protection: None  Other Topics Concern   Not on file  Social History Narrative   Not on file   Social Determinants of Health   Financial Resource Strain: Not on file  Food Insecurity: Not on file  Transportation Needs: Not on file  Physical Activity: Not on file  Stress: Not on file  Social Connections: Not  on file  Intimate Partner Violence: Not on file     Allergies  Allergen Reactions   Keflex [Cephalexin]    Pollen Extract      Outpatient Medications Prior to Visit  Medication Sig Dispense Refill   albuterol (PROVENTIL HFA) 108 (90 Base) MCG/ACT inhaler Inhale 2 puffs into the lungs every 6 (six) hours as needed for wheezing or shortness of breath. 18 g 6   albuterol (PROVENTIL) (2.5 MG/3ML) 0.083% nebulizer solution Take 3 mLs (2.5 mg total) by nebulization every 6 (six) hours as needed for wheezing or shortness of breath. 75 mL 12   ALPRAZolam (XANAX) 1 MG tablet Take 1 mg by mouth 2 (two) times daily.     azithromycin (ZITHROMAX) 250 MG tablet Two tablets on day 1, then 1 tablet  daily till gone 6 tablet 0   Budeson-Glycopyrrol-Formoterol (BREZTRI AEROSPHERE) 160-9-4.8 MCG/ACT AERO INHALE 2 PUFFS INTO THE LUNGS IN THE MORNING AND AT BEDTIME. 10.7 g 11   diclofenac Sodium (VOLTAREN) 1 % GEL Apply 2-4 g topically daily.     Dupilumab (DUPIXENT) 300 MG/2ML SOPN Inject 300 mg into the skin every 14 (fourteen) days. 12 mL 1   famotidine (PEPCID) 20 MG tablet Take 20 mg by mouth daily.     fluticasone (FLONASE) 50 MCG/ACT nasal spray PLACE 1 SPRAY INTO BOTH NOSTRILS DAILY. 16 g 11   levETIRAcetam (KEPPRA) 750 MG tablet Take 1 tablet (750 mg total) by mouth 2 (two) times daily. 60 tablet 6   meloxicam (MOBIC) 7.5 MG tablet TAKE 1 TABLET (7.5 MG TOTAL) BY MOUTH DAILY. 30 tablet 0   montelukast (SINGULAIR) 10 MG tablet Take 1 tablet (10 mg total) by mouth at bedtime. 30 tablet 6   nicotine (NICODERM CQ - DOSED IN MG/24 HR) 7 mg/24hr patch PLACE 1 PATCH (7 MG TOTAL) ONTO THE SKIN DAILY. 30 patch 11   nystatin (MYCOSTATIN) 100000 UNIT/ML suspension Take 5 mLs (500,000 Units total) by mouth 4 (four) times daily. 60 mL 0   omeprazole (PRILOSEC) 20 MG capsule Take 20 mg by mouth daily.     predniSONE (DELTASONE) 10 MG tablet Take 4 tabs by mouth for 3 days, then 3 for 3 days, 2 for 3 days, 1 for 3 days and stop 30 tablet 0   sertraline (ZOLOFT) 50 MG tablet Take by mouth.     Spacer/Aero-Holding Chambers DEVI 1 each by Does not apply route in the morning and at bedtime. Please use with MDI inhalers 1 each 0   tiZANidine (ZANAFLEX) 4 MG tablet TAKE 1 TABLET (4 MG TOTAL) BY MOUTH EVERY 6 (SIX) HOURS AS NEEDED FOR MUSCLE SPASMS. 60 tablet 2   varenicline (CHANTIX) 1 MG tablet TAKE 1 TABLET (1 MG TOTAL) BY MOUTH 2 (TWO) TIMES DAILY. 60 tablet 1   Facility-Administered Medications Prior to Visit  Medication Dose Route Frequency Provider Last Rate Last Admin   methylPREDNISolone acetate (DEPO-MEDROL) injection 80 mg  80 mg Intramuscular Once Omar Person, MD           Objective:    Physical Exam:  General appearance: 47 y.o., female, NAD, conversant  Eyes: anicteric sclerae; PERRL, tracking appropriately HENT: NCAT; MMM, hoarse Neck: Trachea midline; no lymphadenopathy, no JVD Lungs: wheeze bl some of which clears with cough, with mildly increased respiratory effort CV: RRR, no murmur  Abdomen: Soft, non-tender; non-distended, BS present  Extremities: No peripheral edema, warm Skin: Normal turgor and texture; no rash Psych: Appropriate affect Neuro: Alert and oriented  to person and place, no focal deficit     There were no vitals filed for this visit.       on RA BMI Readings from Last 3 Encounters:  12/31/22 34.36 kg/m  09/13/22 33.81 kg/m  06/04/22 34.13 kg/m   Wt Readings from Last 3 Encounters:  12/31/22 173 lb (78.5 kg)  09/13/22 167 lb 6.4 oz (75.9 kg)  06/04/22 169 lb (76.7 kg)     CBC    Component Value Date/Time   WBC 9.0 03/20/2022 1039   RBC 5.14 (H) 03/20/2022 1039   HGB 10.2 (L) 03/20/2022 1039   HCT 34.1 (L) 03/20/2022 1039   PLT 521.0 (H) 03/20/2022 1039   MCV 66.4 Repeated and verified X2. (L) 03/20/2022 1039   MCH 23.6 (L) 01/10/2021 2025   MCHC 29.8 (L) 03/20/2022 1039   RDW 21.4 (H) 03/20/2022 1039   LYMPHSABS 2.6 03/20/2022 1039   MONOABS 0.8 03/20/2022 1039   EOSABS 0.4 03/20/2022 1039   BASOSABS 0.2 (H) 03/20/2022 1039    Eos 200 IgE 59, aspergillus specific IgE neg  Chest Imaging: CXR 12/16/21 reviewed by me accounting for difference in penetration not significantly changed  HRCT Chest 03/14/22 reviewed by me with enlarged PA, bronchial wall thickening, borderline bronchiolectasis, mild emphysema  PSG 01/03/23 had desaturation requiring 1L O2, ahi 0.5, did have frequent limb movement with arousal  Pulmonary Functions Testing Results:    Latest Ref Rng & Units 02/06/2022   11:55 AM  PFT Results  FVC-Pre L 1.88   FVC-Predicted Pre % 73   FVC-Post L 1.91   FVC-Predicted Post % 74   Pre FEV1/FVC % % 48    Post FEV1/FCV % % 49   FEV1-Pre L 0.90   FEV1-Predicted Pre % 42   FEV1-Post L 0.93   DLCO uncorrected ml/min/mmHg 6.03   DLCO UNC% % 32   DLCO corrected ml/min/mmHg 6.03   DLCO COR %Predicted % 32   DLVA Predicted % 37   TLC L 4.36   TLC % Predicted % 97   RV % Predicted % 156   Severe obstruction, air trapping, severely reduced diffusing capacity     Assessment & Plan:   # 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 stay on chantix and would start also using nicotine patch daily - we'll see if we can get you dupixent for severe persistent asthma, eosinophilic asthma - Breztri 2 puffs twice daily with spacer rinse mouth and spacer after use - flutter valve 10 slow but firm puffs twice daily  - flonase 1 spray each nostril after clearing your nose out of crusting following a shower - neti pot rinse with distilled water or boiled then cooled water and salt pack - see instructions. This is for sinus congestion, crusting, and post-nasal drainage - can add zyrtec or claritin daily over the counter if allergy real bad  RTC 6 weeks    Omar Person, MD [facility] 03/06/2023 12:04 PM

## 2023-03-06 NOTE — Telephone Encounter (Signed)
Patient due for PA renewal for Dupixent (expires on 03/21/2023). She has not been seen since sarting Dupixent on 09/30/2022 and has been filling medicaton consistently. She needs f/u appointment so we have updated clinicals showing if any improvement since starting Dupixent  Has been no-show to two appointments and if no-show to this appointment, will not be refilling Dupixent  Chesley Mires, PharmD, MPH, BCPS, CPP Clinical Pharmacist (Rheumatology and Pulmonology)

## 2023-03-07 ENCOUNTER — Other Ambulatory Visit: Payer: Self-pay | Admitting: Student

## 2023-03-07 ENCOUNTER — Encounter: Payer: Self-pay | Admitting: Student

## 2023-03-07 ENCOUNTER — Other Ambulatory Visit (HOSPITAL_COMMUNITY): Payer: Self-pay

## 2023-03-07 ENCOUNTER — Ambulatory Visit: Payer: Medicaid Other | Admitting: Student

## 2023-03-07 DIAGNOSIS — J4551 Severe persistent asthma with (acute) exacerbation: Secondary | ICD-10-CM

## 2023-03-07 NOTE — Telephone Encounter (Signed)
Patient no-show to appointment. Further refills of Dupixent will not be sent  Chesley Mires, PharmD, MPH, BCPS, CPP Clinical Pharmacist (Rheumatology and Pulmonology)

## 2023-03-18 ENCOUNTER — Ambulatory Visit
Admission: RE | Admit: 2023-03-18 | Discharge: 2023-03-18 | Disposition: A | Discharge status: Home / Self Care | Payer: Medicaid Other | Source: Ambulatory Visit | Attending: Physician Assistant | Admitting: Physician Assistant

## 2023-03-18 DIAGNOSIS — Z1231 Encounter for screening mammogram for malignant neoplasm of breast: Secondary | ICD-10-CM

## 2023-03-20 ENCOUNTER — Other Ambulatory Visit (HOSPITAL_COMMUNITY): Payer: Self-pay

## 2023-03-20 ENCOUNTER — Other Ambulatory Visit: Payer: Self-pay | Admitting: Physician Assistant

## 2023-03-20 DIAGNOSIS — R928 Other abnormal and inconclusive findings on diagnostic imaging of breast: Secondary | ICD-10-CM

## 2023-03-24 ENCOUNTER — Ambulatory Visit
Admission: RE | Admit: 2023-03-24 | Discharge: 2023-03-24 | Disposition: A | Discharge status: Home / Self Care | Payer: Medicaid Other | Source: Ambulatory Visit | Attending: Physician Assistant | Admitting: Physician Assistant

## 2023-03-24 ENCOUNTER — Other Ambulatory Visit: Payer: Self-pay | Admitting: Physician Assistant

## 2023-03-24 DIAGNOSIS — R928 Other abnormal and inconclusive findings on diagnostic imaging of breast: Secondary | ICD-10-CM

## 2023-03-24 DIAGNOSIS — N6320 Unspecified lump in the left breast, unspecified quadrant: Secondary | ICD-10-CM

## 2023-03-28 ENCOUNTER — Telehealth: Payer: Self-pay | Admitting: Pharmacist

## 2023-03-28 ENCOUNTER — Ambulatory Visit (INDEPENDENT_AMBULATORY_CARE_PROVIDER_SITE_OTHER): Payer: Medicaid Other | Admitting: Student

## 2023-03-28 ENCOUNTER — Encounter: Payer: Self-pay | Admitting: Student

## 2023-03-28 DIAGNOSIS — J4551 Severe persistent asthma with (acute) exacerbation: Secondary | ICD-10-CM

## 2023-03-28 MED ORDER — FLUCONAZOLE 150 MG PO TABS: 150.0000 mg | ORAL_TABLET | Freq: Once | ORAL | 0 refills | Status: AC

## 2023-03-28 MED ORDER — MONTELUKAST SODIUM 10 MG PO TABS
10.0000 mg | ORAL_TABLET | Freq: Every day | ORAL | 6 refills | Status: AC
Start: 2023-03-28 — End: ?

## 2023-03-28 MED ORDER — PREDNISONE 10 MG PO TABS
ORAL_TABLET | ORAL | 0 refills | Status: DC
Start: 2023-03-28 — End: 2023-12-10

## 2023-03-28 MED ORDER — DOXYCYCLINE HYCLATE 100 MG PO TABS
100.0000 mg | ORAL_TABLET | Freq: Two times a day (BID) | ORAL | 0 refills | Status: DC
Start: 2023-03-28 — End: 2023-12-10

## 2023-03-28 MED ORDER — BREZTRI AEROSPHERE 160-9-4.8 MCG/ACT IN AERO: 2.0000 | INHALATION_SPRAY | Freq: Two times a day (BID) | RESPIRATORY_TRACT | 11 refills | Status: DC

## 2023-03-28 NOTE — Progress Notes (Signed)
Synopsis: Referred for asthma by Norm Salt, PA  Subjective:   PATIENT ID: Amber Clarke GENDER: female DOB: [birth year], MRN: [id number]  Chief Complaint  Patient presents with   Follow-up    She ran out of Dupixent approx 1 month ago- has increased wheezing and cough with yellow to green sputum. She has had some increased SOB- using her albuterol inhaler about 4 x per day and neb "every 2 hours".     47yF with history of asthma, seizures. Had covid in 02/2021 wasn't severe. She is not vaccinated for covid-19.  She has been on breo since December taking 1 puff twice daily, not rinsing mouth afterward.   She has a very bad cough. Productive cough. Has been bad since Nov of last year. She does have sinus congestion, nasal drainage. She is on singulair has been on it for a while. She says it burns when she tried using flonase in past. SHe takes omeprazole before dinner.   She has DOE to half block maybe. Gradually worsening but breo has helped some.  Inhalers tried: Albuterol Advair diskus no problems Dulera made her vision blurry Virgel Bouquet likes it  COPD in mother and father.   She is from [place]. Moved to [place] 3 ya. Smokes half ppd. 2 blunts MJ. No vaping. No pets.   Interval HPI:   Treated for exacerbation asthma-copd overlap last visit, started airway clearance regimen, working on smoking cessation, started on dupixent  Started smoking again. Picked up patches. Does feel like she has far less dyspnea and wheeze when she was on dupixent. Last injection was back in May.  Taking breztri 2 puffs twice daily.    She does wonder if she's developed some sort of 'respiratory infection.' Coughing up yellowish, thick mucus.     Otherwise pertinent review of systems is negative.    Past Medical History:  Diagnosis Date   Asthma    Seizures (HCC)      Family History  Problem Relation Age of Onset   Hypertension Mother    Diabetes Mother    COPD Mother    Asthma Mother     Hypertension Father    Asthma Father    Heart disease Sister    Heart disease Maternal Grandmother    Breast cancer Neg Hx      Past Surgical History:  Procedure Laterality Date   CHOLECYSTECTOMY     TUBAL LIGATION      Social History   Socioeconomic History   Marital status: Single    Spouse name: Not on file   Number of children: Not on file   Years of education: Not on file   Highest education level: Not on file  Occupational History   Not on file  Tobacco Use   Smoking status: Every Day    Packs/day: 0.25    Years: 28.00    Additional pack years: 0.00    Total pack years: 7.00    Types: Cigarettes   Smokeless tobacco: Never   Tobacco comments:    4 cigs daily 03/28/23  Vaping Use   Vaping Use: Never used  Substance and Sexual Activity   Alcohol use: Not Currently   Drug use: Not Currently   Sexual activity: Yes    Birth control/protection: None  Other Topics Concern   Not on file  Social History Narrative   Not on file   Social Determinants of Health   Financial Resource Strain: Not on file  Food Insecurity: Not on  file  Transportation Needs: Not on file  Physical Activity: Not on file  Stress: Not on file  Social Connections: Not on file  Intimate Partner Violence: Not on file     Allergies  Allergen Reactions   Keflex [Cephalexin]    Pollen Extract      Outpatient Medications Prior to Visit  Medication Sig Dispense Refill   albuterol (PROVENTIL HFA) 108 (90 Base) MCG/ACT inhaler Inhale 2 puffs into the lungs every 6 (six) hours as needed for wheezing or shortness of breath. 18 g 6   albuterol (PROVENTIL) (2.5 MG/3ML) 0.083% nebulizer solution Take 3 mLs (2.5 mg total) by nebulization every 6 (six) hours as needed for wheezing or shortness of breath. 75 mL 12   ALPRAZolam (XANAX) 1 MG tablet Take 1 mg by mouth 2 (two) times daily.     diclofenac Sodium (VOLTAREN) 1 % GEL Apply 2-4 g topically daily.     Dupilumab (DUPIXENT) 300 MG/2ML SOPN  Inject 300 mg into the skin every 14 (fourteen) days. 12 mL 1   famotidine (PEPCID) 20 MG tablet Take 20 mg by mouth daily.     fluticasone (FLONASE) 50 MCG/ACT nasal spray PLACE 1 SPRAY INTO BOTH NOSTRILS DAILY. 16 g 11   levETIRAcetam (KEPPRA) 750 MG tablet Take 1 tablet (750 mg total) by mouth 2 (two) times daily. 60 tablet 6   meloxicam (MOBIC) 7.5 MG tablet TAKE 1 TABLET (7.5 MG TOTAL) BY MOUTH DAILY. 30 tablet 0   nystatin (MYCOSTATIN) 100000 UNIT/ML suspension Take 5 mLs (500,000 Units total) by mouth 4 (four) times daily. 60 mL 0   omeprazole (PRILOSEC) 20 MG capsule Take 20 mg by mouth daily.     sertraline (ZOLOFT) 50 MG tablet Take by mouth.     Spacer/Aero-Holding Chambers DEVI 1 each by Does not apply route in the morning and at bedtime. Please use with MDI inhalers 1 each 0   tiZANidine (ZANAFLEX) 4 MG tablet TAKE 1 TABLET (4 MG TOTAL) BY MOUTH EVERY 6 (SIX) HOURS AS NEEDED FOR MUSCLE SPASMS. 60 tablet 2   varenicline (CHANTIX) 1 MG tablet TAKE 1 TABLET (1 MG TOTAL) BY MOUTH 2 (TWO) TIMES DAILY. 60 tablet 1   Budeson-Glycopyrrol-Formoterol (BREZTRI AEROSPHERE) 160-9-4.8 MCG/ACT AERO INHALE 2 PUFFS INTO THE LUNGS IN THE MORNING AND AT BEDTIME. 10.7 g 11   montelukast (SINGULAIR) 10 MG tablet Take 1 tablet (10 mg total) by mouth at bedtime. 30 tablet 6   nicotine (NICODERM CQ - DOSED IN MG/24 HR) 7 mg/24hr patch PLACE 1 PATCH (7 MG TOTAL) ONTO THE SKIN DAILY. (Patient not taking: Reported on 03/28/2023) 30 patch 11   azithromycin (ZITHROMAX) 250 MG tablet Two tablets on day 1, then 1 tablet daily till gone 6 tablet 0   predniSONE (DELTASONE) 10 MG tablet Take 4 tabs by mouth for 3 days, then 3 for 3 days, 2 for 3 days, 1 for 3 days and stop 30 tablet 0   Facility-Administered Medications Prior to Visit  Medication Dose Route Frequency Provider Last Rate Last Admin   methylPREDNISolone acetate (DEPO-MEDROL) injection 80 mg  80 mg Intramuscular Once Omar Person, MD            Objective:   Physical Exam:  General appearance: 47 y.o.,, female, NAD, conversant  Eyes: anicteric sclerae; PERRL, tracking appropriately HENT: NCAT; MMM, hoarse Neck: Trachea midline; no lymphadenopathy, no JVD Lungs: wheeze bl, with mildly increased respiratory effort CV: RRR, no murmur  Abdomen: Soft, non-tender; non-distended,  BS present  Extremities: No peripheral edema, warm Skin: Normal turgor and texture; no rash Psych: Appropriate affect Neuro: Alert and oriented to person and place, no focal deficit     Vitals:   03/28/23 0959  BP: 114/66  Pulse: 80  Temp: 98.4 F (36.9 C)  TempSrc: Oral  SpO2: 95%  Weight: 171 lb 6.4 oz (77.7 kg)  Height: 4' 11.5" (1.511 m)       95% on RA BMI Readings from Last 3 Encounters:  03/28/23 34.04 kg/m  12/31/22 34.36 kg/m  09/13/22 33.81 kg/m   Wt Readings from Last 3 Encounters:  03/28/23 171 lb 6.4 oz (77.7 kg)  12/31/22 173 lb (78.5 kg)  09/13/22 167 lb 6.4 oz (75.9 kg)     CBC    Component Value Date/Time   WBC 9.0 03/20/2022 1039   RBC 5.14 (H) 03/20/2022 1039   HGB 10.2 (L) 03/20/2022 1039   HCT 34.1 (L) 03/20/2022 1039   PLT 521.0 (H) 03/20/2022 1039   MCV 66.4 Repeated and verified X2. (L) 03/20/2022 1039   MCH 23.6 (L) 01/10/2021 2025   MCHC 29.8 (L) 03/20/2022 1039   RDW 21.4 (H) 03/20/2022 1039   LYMPHSABS 2.6 03/20/2022 1039   MONOABS 0.8 03/20/2022 1039   EOSABS 0.4 03/20/2022 1039   BASOSABS 0.2 (H) 03/20/2022 1039    Eos 200 IgE 59, aspergillus specific IgE neg  Chest Imaging: CXR 12/16/21 reviewed by me accounting for difference in penetration not significantly changed  HRCT Chest 03/14/22 reviewed by me with enlarged PA, bronchial wall thickening, borderline bronchiolectasis, mild emphysema  PSG 01/03/23 had desaturation requiring 1L O2, ahi 0.5, did have frequent limb movement with arousal  Pulmonary Functions Testing Results:    Latest Ref Rng & Units 02/06/2022   11:55 AM   PFT Results  FVC-Pre L 1.88   FVC-Predicted Pre % 73   FVC-Post L 1.91   FVC-Predicted Post % 74   Pre FEV1/FVC % % 48   Post FEV1/FCV % % 49   FEV1-Pre L 0.90   FEV1-Predicted Pre % 42   FEV1-Post L 0.93   DLCO uncorrected ml/min/mmHg 6.03   DLCO UNC% % 32   DLCO corrected ml/min/mmHg 6.03   DLCO COR %Predicted % 32   DLVA Predicted % 37   TLC L 4.36   TLC % Predicted % 97   RV % Predicted % 156   Severe obstruction, air trapping, severely reduced diffusing capacity     Assessment & Plan:   # 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 stay on chantix and would start also using nicotine patch daily - we'll see if we can refill dupixent for severe persistent asthma, eosinophilic asthma - Breztri 2 puffs twice daily with spacer rinse mouth and spacer after use - flutter valve 10 slow but firm puffs twice daily  - flonase 1 spray each nostril after clearing your nose out of crusting following a shower - neti pot rinse with distilled water or boiled then cooled water and salt pack - see instructions. This is for sinus congestion, crusting, and post-nasal drainage - can add zyrtec or claritin daily over  the counter if allergy real bad - spirometry in office next visit  RTC 3 months with Dr. Norman Clay, MD [facility] 03/28/2023 12:15 PM

## 2023-03-28 NOTE — Patient Instructions (Addendum)
-   prednisone taper prescribed, doxycycline - keep up the good work with cutting back on cigs. stay on chantix and would start also using nicotine patch daily - we'll see if we can get you dupixent for severe persistent asthma, eosinophilic asthma again - Breztri 2 puffs twice daily with spacer rinse mouth and spacer after use - flutter valve 10 slow but firm puffs twice daily  - flonase 1 spray each nostril after clearing your nose out of crusting following a shower - neti pot rinse with distilled water or boiled then cooled water and salt pack - see instructions. This is for sinus congestion, crusting, and post-nasal drainage - can add zyrtec or claritin daily over the counter if allergy real bad - spirometry next visit

## 2023-03-28 NOTE — Telephone Encounter (Signed)
Pending OV note from today, please renew Dupixent PA so patient may restart treatment.  Chesley Mires, PharmD, MPH, BCPS, CPP Clinical Pharmacist (Rheumatology and Pulmonology)

## 2023-03-31 ENCOUNTER — Other Ambulatory Visit: Payer: Self-pay | Admitting: Student

## 2023-03-31 ENCOUNTER — Other Ambulatory Visit (HOSPITAL_COMMUNITY): Payer: Self-pay

## 2023-03-31 DIAGNOSIS — J4551 Severe persistent asthma with (acute) exacerbation: Secondary | ICD-10-CM

## 2023-04-01 ENCOUNTER — Other Ambulatory Visit (HOSPITAL_COMMUNITY): Payer: Self-pay

## 2023-04-01 NOTE — Telephone Encounter (Signed)
Submitted a Prior Authorization request to [employer] for NUCALA via fax on CoverMyMeds. Will update once we receive a response.  Key: [id number]

## 2023-04-02 ENCOUNTER — Other Ambulatory Visit (HOSPITAL_COMMUNITY): Payer: Self-pay

## 2023-04-02 ENCOUNTER — Other Ambulatory Visit: Payer: Self-pay

## 2023-04-02 MED ORDER — DUPIXENT 300 MG/2ML ~~LOC~~ SOAJ
300.0000 mg | SUBCUTANEOUS | 1 refills | Status: DC
Start: 2023-04-02 — End: 2023-09-25
  Filled 2023-04-02: qty 4, 28d supply, fill #0
  Filled 2023-04-24: qty 4, 28d supply, fill #1
  Filled 2023-05-30: qty 4, 28d supply, fill #2
  Filled 2023-06-27: qty 4, 28d supply, fill #3
  Filled 2023-07-29: qty 4, 28d supply, fill #4
  Filled 2023-09-03: qty 4, 28d supply, fill #5

## 2023-04-02 NOTE — Telephone Encounter (Addendum)
Received notification from [facility] regarding a prior authorization for DUPIXENT. Authorization has been APPROVED from 04/01/23 to 03/31/24. Approval letter sent to scan center.  Per test claim, copay for 28 days supply is $4  Patient can continue to fill through [facility]: [phone]   Phone # [phone]  Refill for Dupixent sent to [facility] today. [employer] updated.  Chesley Mires, PharmD, MPH, BCPS, CPP Clinical Pharmacist (Rheumatology and Pulmonology)

## 2023-04-03 ENCOUNTER — Other Ambulatory Visit: Payer: Self-pay

## 2023-04-08 ENCOUNTER — Ambulatory Visit: Payer: Medicaid Other | Admitting: Student

## 2023-04-22 ENCOUNTER — Other Ambulatory Visit: Payer: Self-pay

## 2023-04-24 ENCOUNTER — Other Ambulatory Visit: Payer: Self-pay

## 2023-04-29 ENCOUNTER — Other Ambulatory Visit (HOSPITAL_COMMUNITY): Payer: Self-pay

## 2023-05-27 ENCOUNTER — Other Ambulatory Visit (HOSPITAL_COMMUNITY): Payer: Self-pay

## 2023-05-30 ENCOUNTER — Other Ambulatory Visit (HOSPITAL_COMMUNITY): Payer: Self-pay

## 2023-06-03 ENCOUNTER — Other Ambulatory Visit (HOSPITAL_COMMUNITY): Payer: Self-pay

## 2023-06-12 ENCOUNTER — Other Ambulatory Visit (HOSPITAL_COMMUNITY): Payer: Self-pay

## 2023-06-26 ENCOUNTER — Ambulatory Visit: Payer: Medicaid Other | Admitting: Internal Medicine

## 2023-06-27 ENCOUNTER — Other Ambulatory Visit (HOSPITAL_COMMUNITY): Payer: Self-pay

## 2023-06-30 ENCOUNTER — Encounter: Payer: Self-pay | Admitting: Internal Medicine

## 2023-07-07 ENCOUNTER — Other Ambulatory Visit: Payer: Self-pay

## 2023-07-28 ENCOUNTER — Encounter: Payer: Self-pay | Admitting: Neurology

## 2023-07-28 ENCOUNTER — Ambulatory Visit (INDEPENDENT_AMBULATORY_CARE_PROVIDER_SITE_OTHER): Payer: Medicaid Other | Admitting: Neurology

## 2023-07-28 VITALS — BP 105/71 | HR 102 | Ht 59.0 in | Wt 169.0 lb

## 2023-07-28 DIAGNOSIS — R569 Unspecified convulsions: Secondary | ICD-10-CM

## 2023-07-28 DIAGNOSIS — G40909 Epilepsy, unspecified, not intractable, without status epilepticus: Secondary | ICD-10-CM | POA: Diagnosis not present

## 2023-07-28 MED ORDER — LEVETIRACETAM 1000 MG PO TABS: 1000.0000 mg | ORAL_TABLET | Freq: Two times a day (BID) | ORAL | 3 refills | Status: DC

## 2023-07-28 NOTE — Progress Notes (Signed)
Chief Complaint  Patient presents with   New Patient (Initial Visit)    Rm 15. Patient alone, reports seizures at the beginning of the year, Migraines at least once a week at this time      ASSESSMENT AND PLAN  Amber Clarke is a 47 y.o. female   Probable complex partial seizure   Complete evaluation with MRI of the brain with without contrast  EEG  Recurrent spell when taking Keppra 750 twice a day, higher dose once on the milligram twice a day  Return To Clinic With NP In 6 Months ok virtual visit.   DIAGNOSTIC DATA (LABS, IMAGING, TESTING) - I reviewed patient records, labs, notes, testing and imaging myself where available.   MEDICAL HISTORY:  Amber Clarke, is a 47year-old female seen in request by her primary care from [facility] Norm Salt for evaluation of seizure, initial evaluation was July 28, 2023    History is obtained from the patient and review of electronic medical records. I personally reviewed pertinent available imaging films in PACS.   PMHx of  Depression Asthma GERD Seizure, on keppra 750mg  bid Smoke,  Initial seizure was in 2009, witnessed by her sister, without warning signs, went into tonic-clonic seizing, bite her tongue, was treated at [place], put on Keppra since then  She still has intermittent but much milder event, oftentimes not evolved into tonic-clonic seizing, reported confusion, dizziness, staring, lasting for few minutes, once every 6 months, most recent 1 was in early 2024  She worked as Lawyer, does home care, debridement, driving,  Laboratory from primary care reviewed from September 2024, Keppra level less than 2, iron deficiency anemia   PHYSICAL EXAM:   Vitals:   07/28/23 1544  BP: 105/71  Pulse: (!) 102  Weight: 169 lb (76.7 kg)  Height: 4\' 11"  (1.499 m)   Not recorded     Body mass index is 34.13 kg/m.  PHYSICAL EXAMNIATION:  Gen: NAD, conversant, well nourised, well groomed                      Cardiovascular: Regular rate rhythm, no peripheral edema, warm, nontender. Eyes: Conjunctivae clear without exudates or hemorrhage Neck: Supple, no carotid bruits. Pulmonary: Clear to auscultation bilaterally   NEUROLOGICAL EXAM:  MENTAL STATUS: Speech/cognition: Awake, alert, oriented to history taking and casual conversation CRANIAL NERVES: CN II: Visual fields are full to confrontation. Pupils are round equal and briskly reactive to light. CN III, IV, VI: extraocular movement are normal. No ptosis. CN V: Facial sensation is intact to light touch CN VII: Face is symmetric with normal eye closure  CN VIII: Hearing is normal to causal conversation. CN IX, X: Phonation is normal. CN XI: Head turning and shoulder shrug are intact  MOTOR: There is no pronator drift of out-stretched arms. Muscle bulk and tone are normal. Muscle strength is normal.  REFLEXES: Reflexes are 2+ and symmetric at the biceps, triceps, knees, and ankles. Plantar responses are flexor.  SENSORY: Intact to light touch, pinprick and vibratory sensation are intact in fingers and toes.  COORDINATION: There is no trunk or limb dysmetria noted.  GAIT/STANCE: Posture is normal. Gait is steady with normal steps, base, arm swing, and turning. Heel and toe walking are normal. Tandem gait is normal.  Romberg is absent.  REVIEW OF SYSTEMS:  Full 14 system review of systems performed and notable only for as above All other review of systems were negative.   ALLERGIES:  Allergies  Allergen Reactions   Keflex [Cephalexin]    Pollen Extract     HOME MEDICATIONS: Current Outpatient Medications  Medication Sig Dispense Refill   albuterol (PROVENTIL HFA) 108 (90 Base) MCG/ACT inhaler Inhale 2 puffs into the lungs every 6 (six) hours as needed for wheezing or shortness of breath. 18 g 6   albuterol (PROVENTIL) (2.5 MG/3ML) 0.083% nebulizer solution Take 3 mLs (2.5 mg total) by nebulization every 6 (six)  hours as needed for wheezing or shortness of breath. 75 mL 12   ALPRAZolam (XANAX) 1 MG tablet Take 1 mg by mouth 2 (two) times daily.     Budeson-Glycopyrrol-Formoterol (BREZTRI AEROSPHERE) 160-9-4.8 MCG/ACT AERO Inhale 2 puffs into the lungs in the morning and at bedtime. 10.7 g 11   diclofenac Sodium (VOLTAREN) 1 % GEL Apply 2-4 g topically daily.     doxycycline (VIBRA-TABS) 100 MG tablet Take 1 tablet (100 mg total) by mouth 2 (two) times daily. 14 tablet 0   Dupilumab (DUPIXENT) 300 MG/2ML SOPN Inject 300 mg into the skin every 14 (fourteen) days. 12 mL 1   famotidine (PEPCID) 20 MG tablet Take 20 mg by mouth daily.     fluticasone (FLONASE) 50 MCG/ACT nasal spray PLACE 1 SPRAY INTO BOTH NOSTRILS DAILY. 16 g 11   levETIRAcetam (KEPPRA) 750 MG tablet Take 1 tablet (750 mg total) by mouth 2 (two) times daily. 60 tablet 6   meloxicam (MOBIC) 7.5 MG tablet TAKE 1 TABLET (7.5 MG TOTAL) BY MOUTH DAILY. 30 tablet 0   montelukast (SINGULAIR) 10 MG tablet Take 1 tablet (10 mg total) by mouth at bedtime. 30 tablet 6   nicotine (NICODERM CQ - DOSED IN MG/24 HR) 7 mg/24hr patch PLACE 1 PATCH (7 MG TOTAL) ONTO THE SKIN DAILY. 30 patch 11   nystatin (MYCOSTATIN) 100000 UNIT/ML suspension Take 5 mLs (500,000 Units total) by mouth 4 (four) times daily. 60 mL 0   omeprazole (PRILOSEC) 20 MG capsule Take 20 mg by mouth daily.     predniSONE (DELTASONE) 10 MG tablet Take 4 tabs by mouth for 3 days, then 3 for 3 days, 2 for 3 days, 1 for 3 days and stop 30 tablet 0   sertraline (ZOLOFT) 50 MG tablet Take by mouth.     Spacer/Aero-Holding Chambers DEVI 1 each by Does not apply route in the morning and at bedtime. Please use with MDI inhalers 1 each 0   tiZANidine (ZANAFLEX) 4 MG tablet TAKE 1 TABLET (4 MG TOTAL) BY MOUTH EVERY 6 (SIX) HOURS AS NEEDED FOR MUSCLE SPASMS. 60 tablet 2   varenicline (CHANTIX) 1 MG tablet TAKE 1 TABLET (1 MG TOTAL) BY MOUTH 2 (TWO) TIMES DAILY. 60 tablet 1   Current  Facility-Administered Medications  Medication Dose Route Frequency Provider Last Rate Last Admin   methylPREDNISolone acetate (DEPO-MEDROL) injection 80 mg  80 mg Intramuscular Once Omar Person, MD        PAST MEDICAL HISTORY: Past Medical History:  Diagnosis Date   Asthma    Seizures (HCC)     PAST SURGICAL HISTORY: Past Surgical History:  Procedure Laterality Date   CHOLECYSTECTOMY     TUBAL LIGATION      FAMILY HISTORY: Family History  Problem Relation Age of Onset   Hypertension Mother    Diabetes Mother    COPD Mother    Asthma Mother    Hypertension Father    Asthma Father    Heart disease Sister    Heart  disease Maternal Grandmother    Breast cancer Neg Hx     SOCIAL HISTORY: Social History   Socioeconomic History   Marital status: Single    Spouse name: Not on file   Number of children: Not on file   Years of education: Not on file   Highest education level: Not on file  Occupational History   Not on file  Tobacco Use   Smoking status: Every Day    Current packs/day: 0.25    Average packs/day: 0.3 packs/day for 28.0 years (7.0 ttl pk-yrs)    Types: Cigarettes   Smokeless tobacco: Never   Tobacco comments:    4 cigs daily 03/28/23  Vaping Use   Vaping status: Never Used  Substance and Sexual Activity   Alcohol use: Not Currently   Drug use: Not Currently   Sexual activity: Yes    Birth control/protection: None  Other Topics Concern   Not on file  Social History Narrative   Not on file   Social Determinants of Health   Financial Resource Strain: Not on file  Food Insecurity: Not on file  Transportation Needs: Not on file  Physical Activity: Not on file  Stress: Not on file  Social Connections: Not on file  Intimate Partner Violence: Not on file      Levert Feinstein, M.D. Ph.D.  [facility] [address], [address] [place], [place] [address] Ph: [phone] Fax: [phone]  CC:  Norm Salt, [place] [address] [place],  [place] [address]  Norm Salt, [place]

## 2023-07-29 ENCOUNTER — Other Ambulatory Visit: Payer: Self-pay

## 2023-07-29 NOTE — Progress Notes (Signed)
Specialty Pharmacy Refill Coordination Note  Amber Clarke is a 47 y.o. female contacted today regarding refills of specialty medication(s) Dupilumab   Patient requested Delivery   Delivery date: 08/08/23   Verified address: Patient address [address]  [place] [address]   Medication will be filled on 08/07/23.

## 2023-08-05 ENCOUNTER — Telehealth: Payer: Self-pay | Admitting: Neurology

## 2023-08-05 NOTE — Telephone Encounter (Signed)
[facility]: [id number] exp. 07/31/23-08/30/23 sent to GI [phone]

## 2023-08-07 ENCOUNTER — Other Ambulatory Visit: Payer: Self-pay

## 2023-09-03 ENCOUNTER — Other Ambulatory Visit: Payer: Self-pay

## 2023-09-03 NOTE — Progress Notes (Signed)
Specialty Pharmacy Refill Coordination Note  Amber Clarke is a 47 y.o. female contacted today regarding refills of specialty medication(s) Dupilumab   Patient requested Delivery   Delivery date: 09/10/23   Verified address: [address] [place] [address]   Medication will be filled on 09/09/23.

## 2023-09-04 ENCOUNTER — Other Ambulatory Visit: Payer: Medicaid Other | Admitting: *Deleted

## 2023-09-04 ENCOUNTER — Encounter: Payer: Self-pay | Admitting: Neurology

## 2023-09-09 ENCOUNTER — Other Ambulatory Visit (HOSPITAL_COMMUNITY): Payer: Self-pay

## 2023-09-09 ENCOUNTER — Other Ambulatory Visit: Payer: Self-pay

## 2023-09-16 ENCOUNTER — Encounter: Payer: Self-pay | Admitting: Neurology

## 2023-09-22 ENCOUNTER — Other Ambulatory Visit: Payer: Medicaid Other

## 2023-09-25 ENCOUNTER — Other Ambulatory Visit (HOSPITAL_COMMUNITY): Payer: Self-pay

## 2023-09-25 ENCOUNTER — Other Ambulatory Visit: Payer: Self-pay | Admitting: Student

## 2023-09-25 DIAGNOSIS — J4551 Severe persistent asthma with (acute) exacerbation: Secondary | ICD-10-CM

## 2023-09-25 NOTE — Progress Notes (Signed)
Specialty Pharmacy Refill Coordination Note  Varsha Sampat is a 47 y.o. female contacted today regarding refills of specialty medication(s) Dupilumab (Dupixent)   Patient requested Delivery   Delivery date: 10/07/23   Verified address: [address] [place] [address]   Medication will be filled on 10/06/23. *Pending refill request.

## 2023-09-25 NOTE — Progress Notes (Signed)
Specialty Pharmacy Ongoing Clinical Assessment Note  Amber Clarke is a 47 y.o. female who is being followed by the specialty pharmacy service for RxSp Asthma/COPD   Patient's specialty medication(s) reviewed today: Dupilumab (Dupixent)   Missed doses in the last 4 weeks: 0   Patient/Caregiver did not have any additional questions or concerns.   Therapeutic benefit summary: Patient is achieving benefit   Adverse events/side effects summary: No adverse events/side effects   Patient's therapy is appropriate to: Continue    Goals Addressed             This Visit's Progress    Minimize recurrence of flares       Patient is on track. Patient will maintain adherence.  Patient reports that she is well-controlled at this time with no recent flares.          Follow up:  6 months  Servando Snare Specialty Pharmacist

## 2023-09-26 ENCOUNTER — Other Ambulatory Visit: Payer: Medicaid Other

## 2023-09-30 ENCOUNTER — Other Ambulatory Visit (HOSPITAL_COMMUNITY): Payer: Self-pay

## 2023-09-30 ENCOUNTER — Other Ambulatory Visit: Payer: Self-pay

## 2023-09-30 MED ORDER — DUPIXENT 300 MG/2ML ~~LOC~~ SOAJ
300.0000 mg | SUBCUTANEOUS | 0 refills | Status: DC
Start: 2023-09-30 — End: 2023-12-12
  Filled 2023-09-30: qty 4, 28d supply, fill #0
  Filled 2023-10-31: qty 4, 28d supply, fill #1

## 2023-09-30 NOTE — Telephone Encounter (Signed)
Refill sent for DUPIXENT to [facility]: [phone]   Dose: 300mg  SQ every 14 days  Last OV: 03/28/23 Provider: former Dr. Thora Lance patient  Next OV: not scheduled and was overdue; no show on 06/26/23  MyChart message sent to patient  Chesley Mires, PharmD, MPH, BCPS Clinical Pharmacist (Rheumatology and Pulmonology)

## 2023-10-06 ENCOUNTER — Other Ambulatory Visit: Payer: Self-pay

## 2023-10-29 ENCOUNTER — Other Ambulatory Visit: Payer: Self-pay

## 2023-10-31 ENCOUNTER — Other Ambulatory Visit: Payer: Self-pay

## 2023-10-31 NOTE — Progress Notes (Signed)
Specialty Pharmacy Refill Coordination Note  Amber Clarke is a 48 y.o. female contacted today regarding refills of specialty medication(s) Dupilumab (Dupixent)   Patient requested (Patient-Rptd) Delivery   Delivery date: 11/04/23  Verified address: (Patient-Rptd) [address]   Medication will be filled on 11/03/23.

## 2023-11-03 ENCOUNTER — Other Ambulatory Visit: Payer: Self-pay

## 2023-11-05 ENCOUNTER — Ambulatory Visit: Payer: Medicaid Other | Admitting: Adult Health

## 2023-11-26 ENCOUNTER — Other Ambulatory Visit: Payer: Self-pay

## 2023-11-26 ENCOUNTER — Other Ambulatory Visit: Payer: Self-pay | Admitting: Internal Medicine

## 2023-11-26 DIAGNOSIS — J4551 Severe persistent asthma with (acute) exacerbation: Secondary | ICD-10-CM

## 2023-11-26 NOTE — Progress Notes (Signed)
Specialty Pharmacy Refill Coordination Note  Karman Biswell is a 48 y.o. female contacted today regarding refills of specialty medication(s) Dupilumab (Dupixent)   Patient requested Delivery   Delivery date: 12/10/23   Verified address: [address] [place] [address]   Medication will be filled on 02.25.25.   This fill date is pending response to refill request from provider. Patient is aware and if they have not received fill by intended date they must follow up with pharmacy.

## 2023-11-27 ENCOUNTER — Other Ambulatory Visit: Payer: Self-pay

## 2023-11-27 NOTE — Telephone Encounter (Signed)
Refill request for Dupixent DENIED> Has been no-show to last two appts. MyChart message sent to patient  Chesley Mires, PharmD, MPH, BCPS, CPP Clinical Pharmacist (Rheumatology and Pulmonology)

## 2023-11-27 NOTE — Telephone Encounter (Signed)
Specialty med. Forward to rx team

## 2023-11-27 NOTE — Progress Notes (Signed)
Refill Request denied; Reason- Patient needs an appointment. Comment: No show to last two appts. Needs appt for refills. Mychart message sent to patient.

## 2023-12-02 ENCOUNTER — Other Ambulatory Visit: Payer: Self-pay

## 2023-12-10 ENCOUNTER — Encounter: Payer: Self-pay | Admitting: Primary Care

## 2023-12-10 ENCOUNTER — Ambulatory Visit (INDEPENDENT_AMBULATORY_CARE_PROVIDER_SITE_OTHER): Payer: Medicaid Other | Admitting: Primary Care

## 2023-12-10 VITALS — BP 126/70 | HR 107 | Temp 98.5°F | Ht 59.5 in | Wt 174.4 lb

## 2023-12-10 DIAGNOSIS — J4551 Severe persistent asthma with (acute) exacerbation: Secondary | ICD-10-CM | POA: Diagnosis not present

## 2023-12-10 DIAGNOSIS — F17210 Nicotine dependence, cigarettes, uncomplicated: Secondary | ICD-10-CM | POA: Diagnosis not present

## 2023-12-10 DIAGNOSIS — J449 Chronic obstructive pulmonary disease, unspecified: Secondary | ICD-10-CM

## 2023-12-10 MED ORDER — METHYLPREDNISOLONE ACETATE 80 MG/ML IJ SUSP
80.0000 mg | Freq: Once | INTRAMUSCULAR | Status: AC
Start: 2023-12-10 — End: 2023-12-10
  Administered 2023-12-10: 80 mg via INTRAMUSCULAR

## 2023-12-10 MED ORDER — AZITHROMYCIN 250 MG PO TABS: ORAL_TABLET | ORAL | 0 refills | Status: AC

## 2023-12-10 MED ORDER — IPRATROPIUM-ALBUTEROL 0.5-2.5 (3) MG/3ML IN SOLN: 3.0000 mL | Freq: Four times a day (QID) | RESPIRATORY_TRACT | 2 refills | Status: AC | PRN

## 2023-12-10 MED ORDER — NYSTATIN 100000 UNIT/ML MT SUSP: 5.0000 mL | Freq: Four times a day (QID) | OROMUCOSAL | 0 refills | Status: AC

## 2023-12-10 MED ORDER — PREDNISONE 10 MG PO TABS: ORAL_TABLET | ORAL | 0 refills | Status: DC

## 2023-12-10 NOTE — Patient Instructions (Addendum)
 Rx: Azithromycin per instructions (allergy to cephalexin)  Prednisone taper starting tomorrow 2/27  Ipratropium-albuterol every 6 hours as needed for shortness of breath  Start mucinex 600mg  twice a day for 7 days Use flutter vale after nebulizer treatment Continue Dupixent injections Continue Breztri two puffs morning and evening   Follow-up: 10-12 days with Waynetta Sandy NP (any open slot/ok to double book on a day that I am not already and write ok'd by [facility] in apt note)

## 2023-12-10 NOTE — Progress Notes (Signed)
 @Patient  ID: Amber Clarke, female    DOB: 1976-01-01, 48 y.o.   MRN: [id number]  Chief Complaint  Patient presents with   Follow-up    Referring provider: Norm Salt, PA  HPI: 48 year old female, current everyday smoker.  Past medical history asthma, seizure disorder, snoring.  12/10/2023 Discussed the use of AI scribe software for clinical note transcription with the patient, who gave verbal consent to proceed.  History of Present Illness   Amber Clarke is a 48 year old female with severe persistent asthma and COPD overlap who presents with an asthma exacerbation.  She is experiencing an exacerbation of asthma symptoms that began two weeks ago, characterized by increased nasal congestion, throat burning, and a scratchy sensation in her throat. Her cough is more congested than usual, producing thick, yellowish mucus. She has a history of severe persistent asthma with COPD overlap and eosinophilic asthma.  Her current medication regimen includes Breztri, Dupixent, Singulair, Zyrtec, and Flonase. She uses Breztri as directed and takes Dupixent every two weeks, although she delayed her last dose by a week to coincide with today's visit. She also uses a neti pot for maintenance. Her symptoms have generally improved on this regimen, particularly with Dupixent and Breztri. She previously experienced a flare-up treated with a prednisone taper and doxycycline.  She experiences recurrent nasal sores that obstruct her airway, which she manages with a neti pot. She has a history of yeast infections and abscesses associated with Breztri use, and she recently resolved an abscess. She also has a history of pneumonia, which she recently recovered from.  She smokes and attributes some of her cough to this habit. She has a history of a smoker's cough, which has been exacerbated by her current symptoms.       Allergies  Allergen Reactions   Keflex [Cephalexin]    Pollen Extract      Immunization History  Administered Date(s) Administered   Influenza,inj,Quad PF,6+ Mos 09/27/2019    Past Medical History:  Diagnosis Date   Asthma    Seizures (HCC)     Tobacco History: Social History   Tobacco Use  Smoking Status Every Day   Current packs/day: 0.25   Average packs/day: 0.3 packs/day for 28.0 years (7.0 ttl pk-yrs)   Types: Cigarettes  Smokeless Tobacco Never  Tobacco Comments   1 cig a day AB, CMA 12-10-23   Ready to quit: Not Answered Counseling given: Not Answered Tobacco comments: 1 cig a day AB, CMA 12-10-23   Outpatient Medications Prior to Visit  Medication Sig Dispense Refill   albuterol (PROVENTIL HFA) 108 (90 Base) MCG/ACT inhaler Inhale 2 puffs into the lungs every 6 (six) hours as needed for wheezing or shortness of breath. 18 g 6   albuterol (PROVENTIL) (2.5 MG/3ML) 0.083% nebulizer solution Take 3 mLs (2.5 mg total) by nebulization every 6 (six) hours as needed for wheezing or shortness of breath. 75 mL 12   ALPRAZolam (XANAX) 1 MG tablet Take 1 mg by mouth 2 (two) times daily.     Budeson-Glycopyrrol-Formoterol (BREZTRI AEROSPHERE) 160-9-4.8 MCG/ACT AERO Inhale 2 puffs into the lungs in the morning and at bedtime. 10.7 g 11   diclofenac Sodium (VOLTAREN) 1 % GEL Apply 2-4 g topically daily.     Dupilumab (DUPIXENT) 300 MG/2ML SOAJ Inject 300 mg into the skin every 14 (fourteen) days. **appt needed for further refills after this** 8 mL 0   famotidine (PEPCID) 20 MG tablet Take 20 mg by mouth daily.  fluticasone (FLONASE) 50 MCG/ACT nasal spray PLACE 1 SPRAY INTO BOTH NOSTRILS DAILY. 16 g 11   levETIRAcetam (KEPPRA) 1000 MG tablet Take 1 tablet (1,000 mg total) by mouth 2 (two) times daily. 180 tablet 3   meloxicam (MOBIC) 7.5 MG tablet TAKE 1 TABLET (7.5 MG TOTAL) BY MOUTH DAILY. 30 tablet 0   montelukast (SINGULAIR) 10 MG tablet Take 1 tablet (10 mg total) by mouth at bedtime. 30 tablet 6   nicotine (NICODERM CQ - DOSED IN MG/24 HR) 7  mg/24hr patch PLACE 1 PATCH (7 MG TOTAL) ONTO THE SKIN DAILY. 30 patch 11   omeprazole (PRILOSEC) 20 MG capsule Take 20 mg by mouth daily.     sertraline (ZOLOFT) 50 MG tablet Take by mouth.     Spacer/Aero-Holding Chambers DEVI 1 each by Does not apply route in the morning and at bedtime. Please use with MDI inhalers 1 each 0   tiZANidine (ZANAFLEX) 4 MG tablet TAKE 1 TABLET (4 MG TOTAL) BY MOUTH EVERY 6 (SIX) HOURS AS NEEDED FOR MUSCLE SPASMS. 60 tablet 2   varenicline (CHANTIX) 1 MG tablet TAKE 1 TABLET (1 MG TOTAL) BY MOUTH 2 (TWO) TIMES DAILY. 60 tablet 1   doxycycline (VIBRA-TABS) 100 MG tablet Take 1 tablet (100 mg total) by mouth 2 (two) times daily. (Patient not taking: Reported on 12/10/2023) 14 tablet 0   nystatin (MYCOSTATIN) 100000 UNIT/ML suspension Take 5 mLs (500,000 Units total) by mouth 4 (four) times daily. (Patient not taking: Reported on 12/10/2023) 60 mL 0   predniSONE (DELTASONE) 10 MG tablet Take 4 tabs by mouth for 3 days, then 3 for 3 days, 2 for 3 days, 1 for 3 days and stop 30 tablet 0   Facility-Administered Medications Prior to Visit  Medication Dose Route Frequency Provider Last Rate Last Admin   methylPREDNISolone acetate (DEPO-MEDROL) injection 80 mg  80 mg Intramuscular Once Omar Person, MD          Review of Systems  Review of Systems   Physical Exam  BP 126/70 (BP Location: Left Arm, Patient Position: Sitting, Cuff Size: Large)   Pulse (!) 107   Temp 98.5 F (36.9 C) (Oral)   Ht 4' 11.5" (1.511 m)   Wt 174 lb 6.4 oz (79.1 kg)   SpO2 98%   BMI 34.64 kg/m  Physical Exam Constitutional:      Appearance: Normal appearance.  HENT:     Head: Normocephalic and atraumatic.     Nose: Nose normal.     Mouth/Throat:     Mouth: Mucous membranes are moist.     Pharynx: Oropharynx is clear. Posterior oropharyngeal erythema present. No oropharyngeal exudate.  Cardiovascular:     Rate and Rhythm: Normal rate and regular rhythm.  Pulmonary:      Effort: Pulmonary effort is normal. No respiratory distress.     Breath sounds: Normal breath sounds. No stridor. No rhonchi or rales.     Comments: Diffuse wheezing  Musculoskeletal:        General: Normal range of motion.  Skin:    General: Skin is warm and dry.  Neurological:     General: No focal deficit present.     Mental Status: She is alert and oriented to person, place, and time. Mental status is at baseline.  Psychiatric:        Mood and Affect: Mood normal.        Behavior: Behavior normal.        Thought Content: Thought  content normal.        Judgment: Judgment normal.      Lab Results:  CBC    Component Value Date/Time   WBC 9.0 03/20/2022 1039   RBC 5.14 (H) 03/20/2022 1039   HGB 10.2 (L) 03/20/2022 1039   HCT 34.1 (L) 03/20/2022 1039   PLT 521.0 (H) 03/20/2022 1039   MCV 66.4 Repeated and verified X2. (L) 03/20/2022 1039   MCH 23.6 (L) 01/10/2021 2025   MCHC 29.8 (L) 03/20/2022 1039   RDW 21.4 (H) 03/20/2022 1039   LYMPHSABS 2.6 03/20/2022 1039   MONOABS 0.8 03/20/2022 1039   EOSABS 0.4 03/20/2022 1039   BASOSABS 0.2 (H) 03/20/2022 1039    BMET    Component Value Date/Time   NA 136 01/10/2021 2025   K 3.9 01/10/2021 2025   CL 106 01/10/2021 2025   CO2 24 01/10/2021 2025   GLUCOSE 115 (H) 01/10/2021 2025   BUN 5 (L) 01/10/2021 2025   CREATININE 0.84 01/10/2021 2025   CALCIUM 9.0 01/10/2021 2025   GFRNONAA >60 01/10/2021 2025    BNP No results found for: "BNP"  ProBNP No results found for: "PROBNP"  Imaging: No results found.   Assessment & Plan:   1. Chronic obstructive pulmonary disease, unspecified COPD type (HCC) (Primary)     Severe Persistent Asthma/COPD Overlap Acute exacerbation with wheezing and increased sputum production. Likely triggered by weather changes, allergies, and a delay in Dupixent administration. ACT score 5.  -Administer Depo Medrol shot (80mg  IM) today. -Start Prednisone taper tomorrow. -Prescribe  Azithromycin x 5 days for suspected bronchitis. -Advise to take Mucinex twice daily. -Provide a flutter valve. -Continue Breztri and Dupixent (refill needed). -Replace Albuterol with Ipratropium-Albuterol nebulizer.  Allergic rhinitis - Continue Singulair, Flonase, and netti pot prn   Thrush  - Rx Nystatin 5ml QID  Follow-up in 10-12 days to assess response to treatment. If symptoms do not improve, patient to contact the office sooner.      Glenford Bayley, NP 12/10/2023

## 2023-12-12 ENCOUNTER — Other Ambulatory Visit: Payer: Self-pay | Admitting: Internal Medicine

## 2023-12-12 DIAGNOSIS — J4551 Severe persistent asthma with (acute) exacerbation: Secondary | ICD-10-CM

## 2023-12-12 IMAGING — CT CT CHEST HIGH RESOLUTION
2 of 7 series · 14 of 36 positions shown, 17 images · non-contrast
Comparison: None Available.

CLINICAL DATA: Interstitial lung disease, evaluate for ABPA,
sarcoid, or other asthma mimics, asthma and COPD, decreased
diffusion capacity, former smoker



[Series 4: high resolution retro · axial · 0.77mm/px · z∈[-502,-260]mm · 11 of 286 slices shown, 14 images]
[im 22/286  mediastinal]
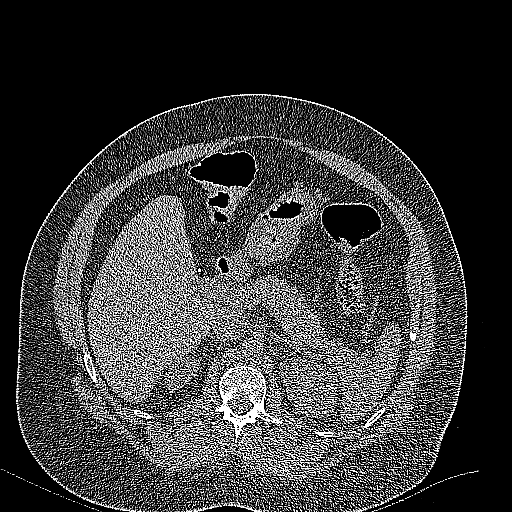
[im 22/286  lung]
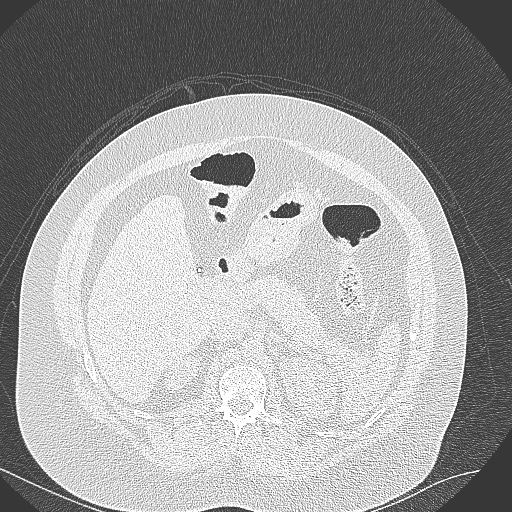
[im 44/286  lung]
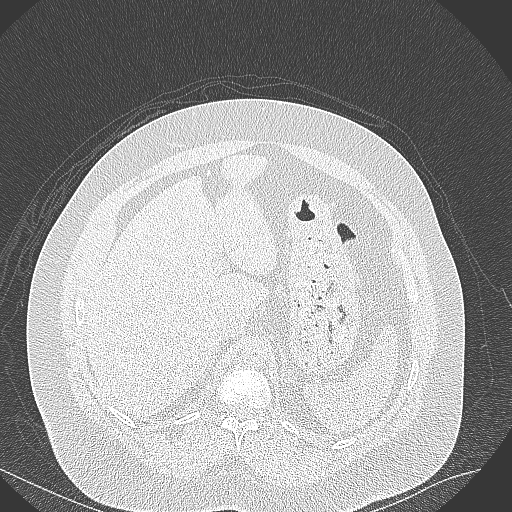
[im 66/286  lung]
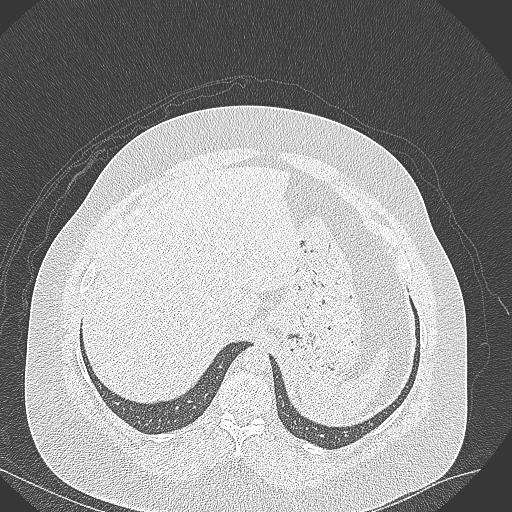
[im 88/286  lung]
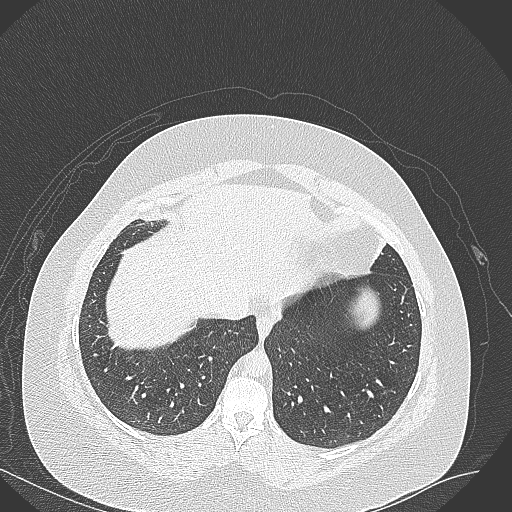
[im 110/286  mediastinal]
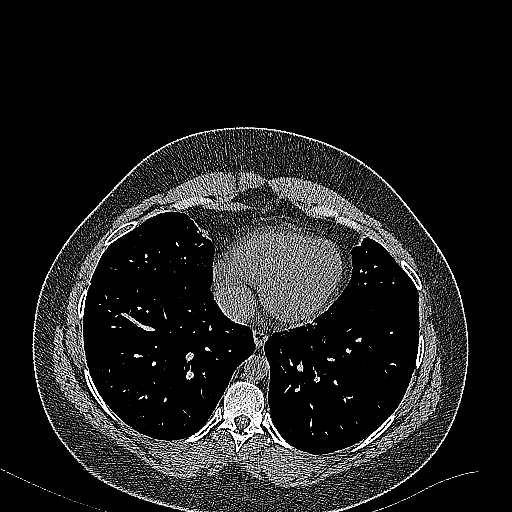
[im 110/286  lung]
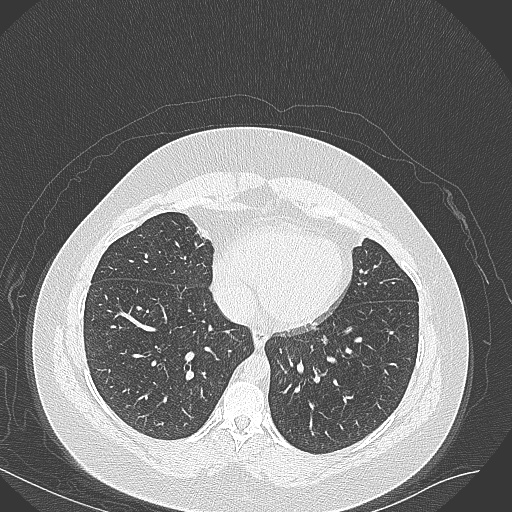
[im 154/286  lung]
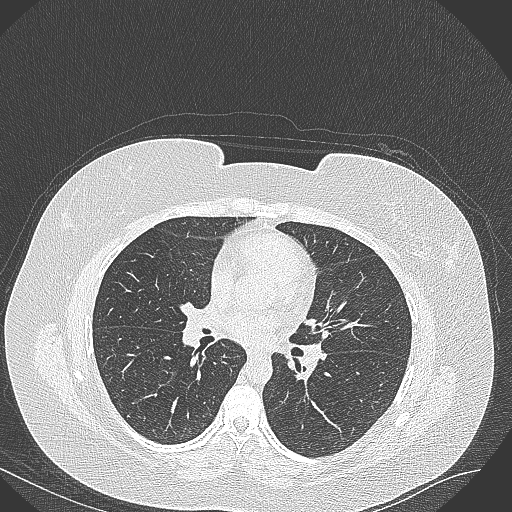
[im 176/286  lung]
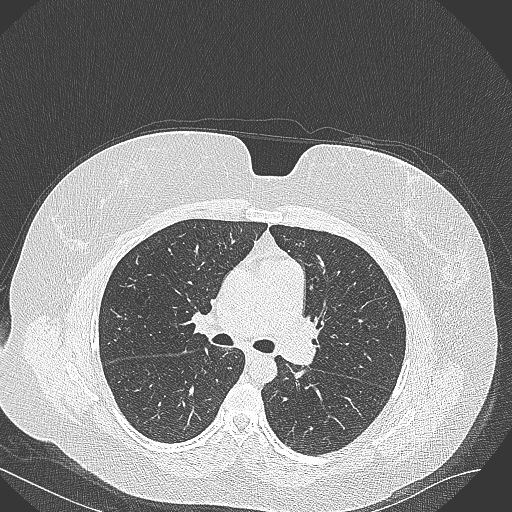
[im 198/286  lung]
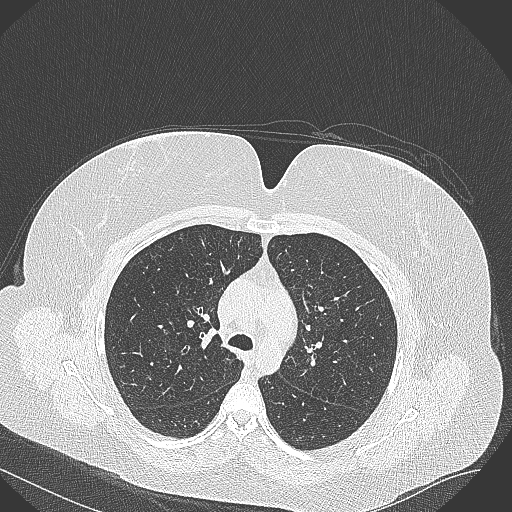
[im 220/286  mediastinal]
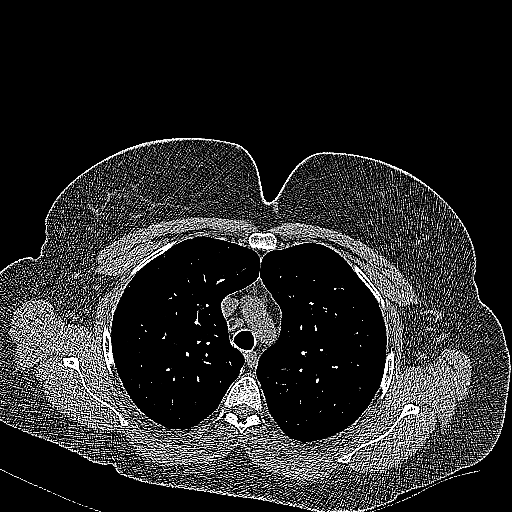
[im 220/286  lung]
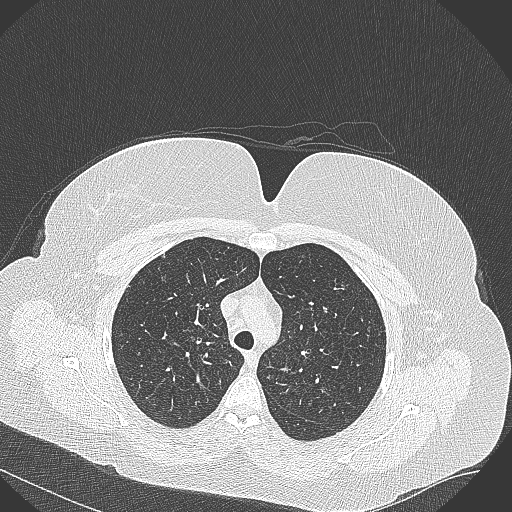
[im 242/286  lung]
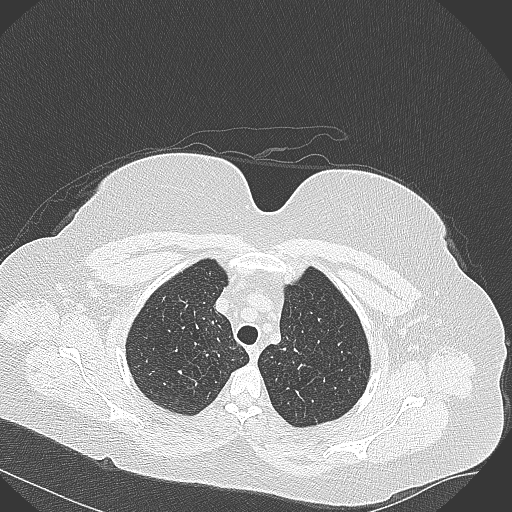
[im 264/286  lung]
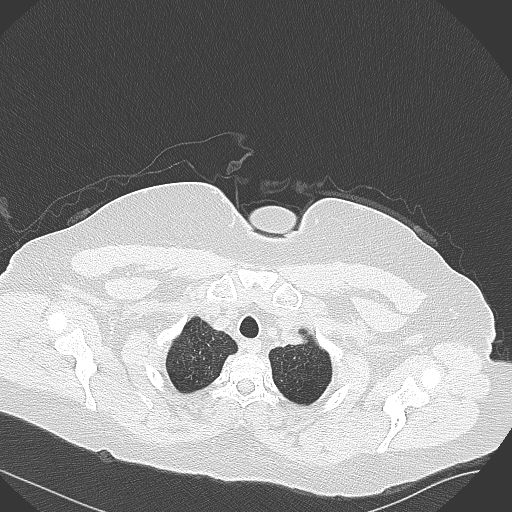

[Series 8: coronal · coronal · 0.57mm/px · 3 of 88 slices shown]
[im 18/88  lung]
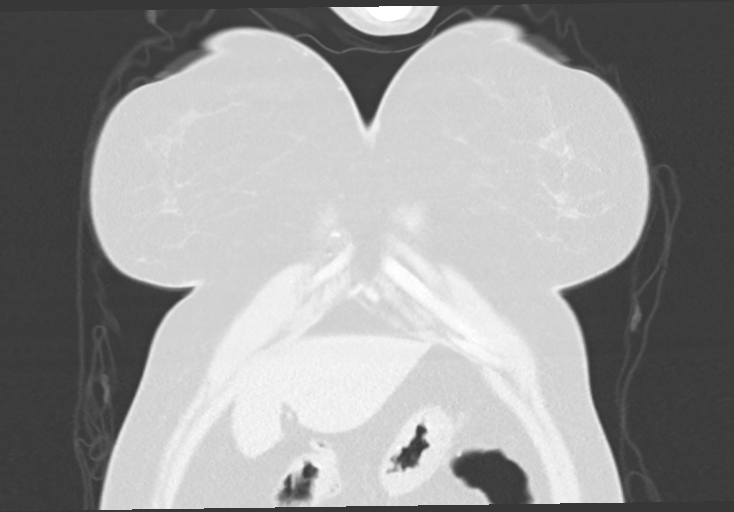
[im 35/88  lung]
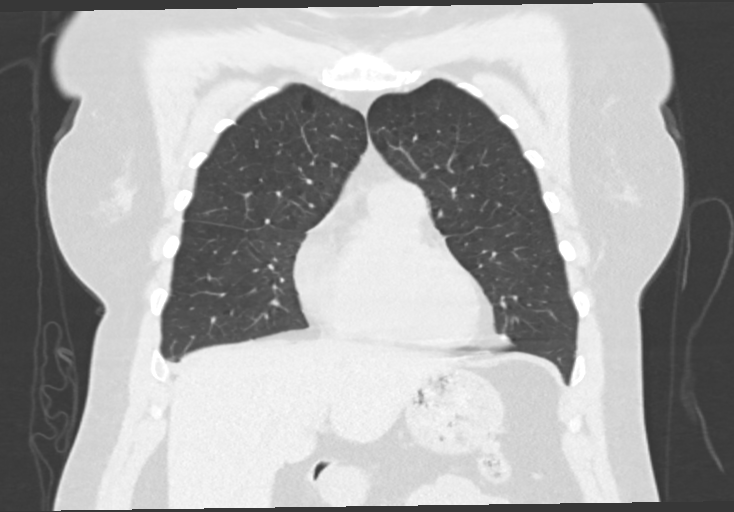
[im 53/88  lung]
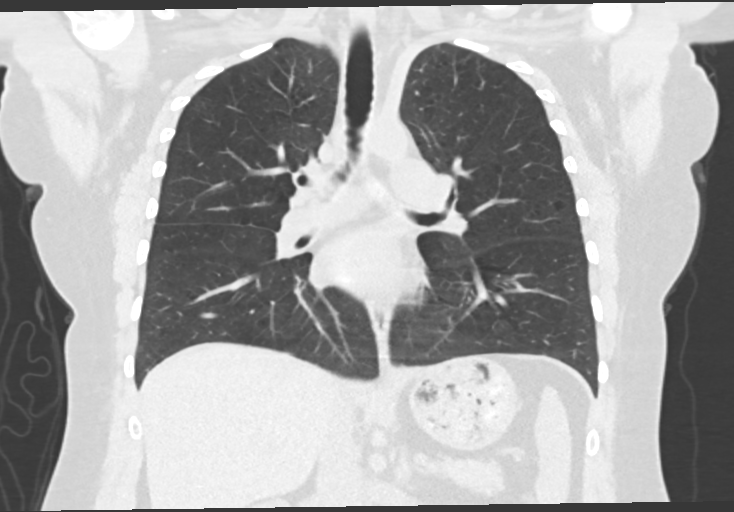

[14 of 36 positions shown; findings below may reference images not displayed]

FINDINGS: Cardiovascular: No significant vascular findings. Normal heart size.
Enlargement of the main pulmonary artery, measuring up to 3.5 cm in
caliber. No pericardial effusion.

Mediastinum/Nodes: No enlarged mediastinal, hilar, or axillary lymph
nodes. Thyroid gland, trachea, and esophagus demonstrate no
significant findings.

Lungs/Pleura: Mild centrilobular emphysema. Mild, diffuse bilateral
bronchial wall thickening. No significant air trapping on expiratory
phase imaging. No pleural effusion or pneumothorax.

Upper Abdomen: No acute abnormality.  Status post cholecystectomy.

Musculoskeletal: No chest wall abnormality. No suspicious osseous
lesions identified.
IMPRESSION: 1. No evidence of fibrotic interstitial lung disease. No significant
air trapping on expiratory phase imaging.
2. Mild, diffuse bilateral bronchial wall thickening, consistent
with nonspecific infectious or inflammatory bronchitis.
3. Mild emphysema.
4. No evidence of ABPA or sarcoidosis per ordering indication.
5. Enlargement of the main pulmonary artery, as can be seen in
pulmonary hypertension.

Emphysema ([id number]).

## 2023-12-15 ENCOUNTER — Other Ambulatory Visit: Payer: Self-pay

## 2023-12-15 ENCOUNTER — Other Ambulatory Visit: Payer: Self-pay | Admitting: Internal Medicine

## 2023-12-15 DIAGNOSIS — J4551 Severe persistent asthma with (acute) exacerbation: Secondary | ICD-10-CM

## 2023-12-15 MED ORDER — DUPIXENT 300 MG/2ML ~~LOC~~ SOAJ
300.0000 mg | SUBCUTANEOUS | 0 refills | Status: DC
Start: 2023-12-15 — End: 2024-02-03
  Filled 2023-12-15: qty 4, 28d supply, fill #0
  Filled 2024-01-09: qty 4, 28d supply, fill #1

## 2023-12-15 NOTE — Progress Notes (Signed)
 Specialty Pharmacy Refill Coordination Note  Amber Clarke is a 48 y.o. female contacted today regarding refills of specialty medication(s) Dupilumab (Dupixent)   Patient requested Delivery   Delivery date: 12/17/23   Verified address: [address] [place] [address]   Medication will be filled on 12/16/23.

## 2023-12-16 ENCOUNTER — Other Ambulatory Visit: Payer: Self-pay

## 2023-12-22 ENCOUNTER — Telehealth: Payer: Self-pay

## 2023-12-22 NOTE — Telephone Encounter (Signed)
 Can you please specify what exactly needs to be done? What admin code?     Barrick, Tiffany  Amol Domanski T, [employer],    The administration code needs added when you add an injection code, J1010. Please add the required admin code to this patient's chart, which should link to the encounter.  Bonnita Levan

## 2023-12-22 NOTE — Telephone Encounter (Signed)
-----   Message from Everetts B sent at 12/17/2023 11:48 AM EST ----- Regarding: Admin Code Needs Added for J1010 Hello,    The administration code needs added when you add an injection code, J1010. Please add the required admin code to this patient's chart, which should link to the encounter.   Bonnita Levan

## 2023-12-23 NOTE — Telephone Encounter (Signed)
 I have added the charge 91478- needle charge for depo

## 2023-12-23 NOTE — Telephone Encounter (Signed)
Can you help me with this?

## 2023-12-24 NOTE — Telephone Encounter (Signed)
 Noted.

## 2024-01-05 ENCOUNTER — Other Ambulatory Visit: Payer: Self-pay

## 2024-01-06 ENCOUNTER — Other Ambulatory Visit: Payer: Self-pay

## 2024-01-06 NOTE — Progress Notes (Signed)
 Opened in error, still need to speak to patient about requested delivery date as DOB on questionnaire. LVM. May use this encounter for refill when she calls back.

## 2024-01-09 ENCOUNTER — Other Ambulatory Visit: Payer: Self-pay

## 2024-01-09 NOTE — Progress Notes (Signed)
 Specialty Pharmacy Refill Coordination Note  Amber Clarke is a 48 y.o. female contacted today regarding refills of specialty medication(s) Dupilumab (Dupixent)   Patient requested Delivery   Delivery date: 01/13/24   Verified address: [address]   Medication will be filled on 01/12/24.

## 2024-01-12 ENCOUNTER — Other Ambulatory Visit: Payer: Self-pay

## 2024-02-03 ENCOUNTER — Other Ambulatory Visit (HOSPITAL_COMMUNITY): Payer: Self-pay

## 2024-02-03 ENCOUNTER — Telehealth: Payer: Self-pay | Admitting: Internal Medicine

## 2024-02-03 ENCOUNTER — Other Ambulatory Visit: Payer: Self-pay

## 2024-02-03 DIAGNOSIS — J4551 Severe persistent asthma with (acute) exacerbation: Secondary | ICD-10-CM

## 2024-02-03 MED ORDER — DUPIXENT 300 MG/2ML ~~LOC~~ SOAJ
300.0000 mg | SUBCUTANEOUS | 1 refills | Status: DC
Start: 2024-02-03 — End: 2024-07-12
  Filled 2024-02-03: qty 12, 84d supply, fill #0
  Filled 2024-02-09 (×2): qty 4, 28d supply, fill #0
  Filled 2024-03-01: qty 4, 28d supply, fill #1
  Filled 2024-03-24: qty 4, 28d supply, fill #2
  Filled 2024-04-28: qty 4, 28d supply, fill #3
  Filled 2024-05-21: qty 4, 28d supply, fill #4
  Filled 2024-06-16: qty 4, 28d supply, fill #5

## 2024-02-03 NOTE — Telephone Encounter (Signed)
 Refill sent for DUPIXENT  to [facility]: [phone]   Dose: 300mg  subcut every 14 days  Last OV: 12/10/23 Provider: Dr. Dione Franks  Next OV: not scheduled  Routing to scheduling team for follow-up on appt scheduling  Geraldene Kleine, PharmD, MPH, BCPS Clinical Pharmacist (Rheumatology and Pulmonology)

## 2024-02-03 NOTE — Telephone Encounter (Signed)
 ATC patient, no one answered so I left a voicemail and MyChart message.

## 2024-02-05 ENCOUNTER — Encounter: Payer: Self-pay | Admitting: Primary Care

## 2024-02-09 ENCOUNTER — Other Ambulatory Visit (HOSPITAL_COMMUNITY): Payer: Self-pay

## 2024-02-09 ENCOUNTER — Other Ambulatory Visit: Payer: Self-pay

## 2024-02-09 NOTE — Progress Notes (Signed)
 Specialty Pharmacy Refill Coordination Note  Amber Clarke is a 48 y.o. female contacted today regarding refills of specialty medication(s) Dupixent .  Patient requested (Patient-Rptd) Delivery   Delivery date: (Patient-Rptd) 02/11/24   Verified address: (Patient-Rptd) [address]   Medication will be filled on 02/10/24.

## 2024-02-10 ENCOUNTER — Other Ambulatory Visit: Payer: Self-pay

## 2024-02-10 NOTE — Telephone Encounter (Signed)
 Patient is scheduled to see Dr Waylan Haggard on 6/26. Nothing further needed.

## 2024-02-19 ENCOUNTER — Telehealth (INDEPENDENT_AMBULATORY_CARE_PROVIDER_SITE_OTHER): Payer: Medicaid Other | Admitting: Adult Health

## 2024-02-19 DIAGNOSIS — Z0289 Encounter for other administrative examinations: Secondary | ICD-10-CM

## 2024-02-19 NOTE — Progress Notes (Signed)
 Patient showed for MyChart video visit but left prior to being able to connect. She will need to be contacted to reschedule visit. Thank you.

## 2024-02-23 NOTE — Progress Notes (Unsigned)
 Virtual Visit via Video Note  I connected with Amber Clarke on 02/24/2024 at  1:15 PM EDT by a video enabled telemedicine application and verified that I am speaking with the correct person using two identifiers.  Location: Patient: at home Provider: in office, [facility]   I discussed the limitations of evaluation and management by telemedicine and the availability of in person appointments. The patient expressed understanding and agreed to proceed.     ASSESSMENT AND PLAN  Amber Clarke is a 48 y.o. female   Probable complex partial seizure   Initial onset 2009, prior seizure occurrence early 2024 Advised to contact [facility] to schedule MRI brain  Will request office reach out to patient to reschedule EEG  Continue Keppra  1000 mg twice daily for seizure prevention - refill up to date  Routine lab work per PCP  Advised to call with any recurrent seizure activity   Follow-up in 9 months via MyChart video visit or earlier if needed     DIAGNOSTIC DATA (LABS, IMAGING, TESTING) - I reviewed patient records, labs, notes, testing and imaging myself where available.   MEDICAL HISTORY:  Update 02/23/2024 JM: Patient returns for follow-up visit via MyChart video visit. This is our first time meeting and time has been spent reviewing past medical history and relevant medical records. Has been stable without any recurrent seizures on Keppra  1000 mg twice daily and tolerating without side effects.  Did not complete MRI brain nor EEG as she needed to work on the days was rescheduled, she plans on contacting offices to get these tests rescheduled.  No questions or concerns at this time.    Consult visit 07/28/2023 Dr. Gracie Lav: Amber Clarke, is a 47year-old female seen in request by her primary care from [facility] Dian[facility] for evaluation of seizure, initial evaluation was July 28, 2023    History is obtained from the patient and review of electronic  medical records. I personally reviewed pertinent available imaging films in PACS.   PMHx of  Depression Asthma GERD Seizure, on keppra  750mg  bid Smoke,  Initial seizure was in 2009, witnessed by her sister, without warning signs, went into tonic-clonic seizing, bite her tongue, was treated at [place] , put on Keppra  since then  She still has intermittent but much milder event, oftentimes not evolved into tonic-clonic seizing, reported confusion, dizziness, staring, lasting for few minutes, once every 6 months, most recent 1 was in early 2024  She worked as Lawyer, does home care, debridement, driving,  Laboratory from primary care reviewed from September 2024, Keppra  level less than 2, iron deficiency anemia   PHYSICAL EXAM:  N/A d/t visit type      REVIEW OF SYSTEMS:  Full 14 system review of systems performed and notable only for as above All other review of systems were negative.   ALLERGIES: Allergies  Allergen Reactions   Keflex [Cephalexin]    Pollen Extract     HOME MEDICATIONS: Current Outpatient Medications  Medication Sig Dispense Refill   albuterol  (PROVENTIL  HFA) 108 (90 Base) MCG/ACT inhaler Inhale 2 puffs into the lungs every 6 (six) hours as needed for wheezing or shortness of breath. 18 g 6   albuterol  (PROVENTIL ) (2.5 MG/3ML) 0.083% nebulizer solution Take 3 mLs (2.5 mg total) by nebulization every 6 (six) hours as needed for wheezing or shortness of breath. 75 mL 12   ALPRAZolam (XANAX) 1 MG tablet Take 1 mg by mouth 2 (two) times daily.  azithromycin  (ZITHROMAX ) 250 MG tablet Per zpack instructions 6 tablet 0   Budeson-Glycopyrrol-Formoterol (BREZTRI  AEROSPHERE) 160-9-4.8 MCG/ACT AERO Inhale 2 puffs into the lungs in the morning and at bedtime. 10.7 g 11   diclofenac  Sodium (VOLTAREN ) 1 % GEL Apply 2-4 g topically daily.     Dupilumab  (DUPIXENT ) 300 MG/2ML SOAJ Inject 300 mg into the skin every 14 (fourteen) days. 12 mL 1   famotidine (PEPCID) 20 MG  tablet Take 20 mg by mouth daily.     fluticasone  (FLONASE ) 50 MCG/ACT nasal spray PLACE 1 SPRAY INTO BOTH NOSTRILS DAILY. 16 g 11   ipratropium-albuterol  (DUONEB) 0.5-2.5 (3) MG/3ML SOLN Take 3 mLs by nebulization every 6 (six) hours as needed. 360 mL 2   levETIRAcetam  (KEPPRA ) 1000 MG tablet Take 1 tablet (1,000 mg total) by mouth 2 (two) times daily. 180 tablet 3   meloxicam  (MOBIC ) 7.5 MG tablet TAKE 1 TABLET (7.5 MG TOTAL) BY MOUTH DAILY. 30 tablet 0   montelukast  (SINGULAIR ) 10 MG tablet Take 1 tablet (10 mg total) by mouth at bedtime. 30 tablet 6   nicotine  (NICODERM CQ  - DOSED IN MG/24 HR) 7 mg/24hr patch PLACE 1 PATCH (7 MG TOTAL) ONTO THE SKIN DAILY. 30 patch 11   nystatin  (MYCOSTATIN ) 100000 UNIT/ML suspension Take 5 mLs (500,000 Units total) by mouth 4 (four) times daily. 120 mL 0   omeprazole (PRILOSEC) 20 MG capsule Take 20 mg by mouth daily.     predniSONE  (DELTASONE ) 10 MG tablet 4 tabs for 3 days, then 3 tabs for 3 days, 2 tabs for 3 days, then 1 tab for 3 days, then stop 30 tablet 0   sertraline (ZOLOFT) 50 MG tablet Take by mouth.     Spacer/Aero-Holding Chambers DEVI 1 each by Does not apply route in the morning and at bedtime. Please use with MDI inhalers 1 each 0   tiZANidine  (ZANAFLEX ) 4 MG tablet TAKE 1 TABLET (4 MG TOTAL) BY MOUTH EVERY 6 (SIX) HOURS AS NEEDED FOR MUSCLE SPASMS. 60 tablet 2   varenicline  (CHANTIX ) 1 MG tablet TAKE 1 TABLET (1 MG TOTAL) BY MOUTH 2 (TWO) TIMES DAILY. 60 tablet 1   Current Facility-Administered Medications  Medication Dose Route Frequency Provider Last Rate Last Admin   methylPREDNISolone  acetate (DEPO-MEDROL ) injection 80 mg  80 mg Intramuscular Once Gloriajean Large, MD        PAST MEDICAL HISTORY: Past Medical History:  Diagnosis Date   Asthma    Seizures (HCC)     PAST SURGICAL HISTORY: Past Surgical History:  Procedure Laterality Date   CHOLECYSTECTOMY     TUBAL LIGATION      FAMILY HISTORY: Family History  Problem  Relation Age of Onset   Hypertension Mother    Diabetes Mother    COPD Mother    Asthma Mother    Hypertension Father    Asthma Father    Heart disease Sister    Heart disease Maternal Grandmother    Breast cancer Neg Hx     SOCIAL HISTORY: Social History   Socioeconomic History   Marital status: Single    Spouse name: Not on file   Number of children: Not on file   Years of education: Not on file   Highest education level: Not on file  Occupational History   Not on file  Tobacco Use   Smoking status: Every Day    Current packs/day: 0.25    Average packs/day: 0.3 packs/day for 28.0 years (7.0 ttl pk-yrs)  Types: Cigarettes   Smokeless tobacco: Never   Tobacco comments:    1 cig a day AB, CMA 12-10-23  Vaping Use   Vaping status: Never Used  Substance and Sexual Activity   Alcohol use: Not Currently   Drug use: Not Currently   Sexual activity: Yes    Birth control/protection: None  Other Topics Concern   Not on file  Social History Narrative   Not on file   Social Drivers of Health   Financial Resource Strain: Not on file  Food Insecurity: Not on file  Transportation Needs: Not on file  Physical Activity: Not on file  Stress: Not on file  Social Connections: Not on file  Intimate Partner Violence: Not on file      I spent 25 minutes of face-to-face and non-face-to-face time with patient.  This included previsit chart review including reviewing prior OV notes, lab review, study review, order entry, electronic health record documentation, patient education and discussion regarding above diagnoses and treatment plan and answered all other questions to patient's satisfaction  Johny Nap, [facility]  [facility] [address] [place], [place] [address]  Phone [phone] Fax [phone] Note: This document was prepared with digital dictation and possible smart phrase technology. Any transcriptional errors that result from  this process are unintentional.

## 2024-02-24 ENCOUNTER — Encounter: Payer: Self-pay | Admitting: Adult Health

## 2024-02-24 ENCOUNTER — Telehealth (INDEPENDENT_AMBULATORY_CARE_PROVIDER_SITE_OTHER): Admitting: Adult Health

## 2024-02-24 DIAGNOSIS — G40909 Epilepsy, unspecified, not intractable, without status epilepticus: Secondary | ICD-10-CM

## 2024-02-24 NOTE — Patient Instructions (Addendum)
 Your Plan:  Continue Keppra  1000 mg twice daily  Please contact [facility] to schedule MRI brain - office number [phone]  You will be contacted by our office to schedule EEG, if you do not receive a call over the next week, please call our office at [phone] to schedule    Follow-up in 9 months via MyChart video visit or call earlier if     Thank you for coming to see us  at [facility]. I hope we have been able to provide you high quality care today.  You may receive a patient satisfaction survey over the next few weeks. We would appreciate your feedback and comments so that we may continue to improve ourselves and the health of our patients.

## 2024-02-26 ENCOUNTER — Ambulatory Visit: Admitting: Neurology

## 2024-02-26 DIAGNOSIS — G40909 Epilepsy, unspecified, not intractable, without status epilepticus: Secondary | ICD-10-CM

## 2024-03-01 ENCOUNTER — Other Ambulatory Visit: Payer: Self-pay | Admitting: Pharmacy Technician

## 2024-03-01 ENCOUNTER — Other Ambulatory Visit: Payer: Self-pay

## 2024-03-01 NOTE — Progress Notes (Signed)
 Specialty Pharmacy Refill Coordination Note  Amber Clarke is a 48 y.o. female contacted today regarding refills of specialty medication(s) Dupilumab  (Dupixent )   Patient requested (Patient-Rptd) Delivery   Delivery date: (Patient-Rptd) 03/01/24 New delivery date 03/05/24   Verified address: (Patient-Rptd) [address]   Medication will be filled on 03/04/24.    Called & spoke with patient Inject due on Monday 03/08/24 aware of new Shipping date

## 2024-03-04 ENCOUNTER — Other Ambulatory Visit: Payer: Self-pay

## 2024-03-19 ENCOUNTER — Ambulatory Visit: Payer: Self-pay | Admitting: Neurology

## 2024-03-19 NOTE — Procedures (Signed)
   HISTORY: 48 year old female with seizure-like activity noted  TECHNIQUE:  This is a routine 16 channel EEG recording with one channel devoted to a limited EKG recording.  It was performed during wakefulness, drowsiness and asleep.  Hyperventilation and photic stimulation were performed as activating procedures.  There are frequent bilateral frontal muscle artifact.    Upon maximum arousal, posterior dominant waking rhythm consistent of rhythmic alpha range activity. Activities are symmetric over the bilateral posterior derivations and attenuated with eye opening.  Photic stimulation did not alter the tracing.  Hyperventilation produced mild/moderate buildup with higher amplitude and the slower activities noted.  During EEG recording, patient developed drowsiness and no deeper stage of sleep was achieved  During EEG recording, there was no epileptiform discharge noted.  EKG demonstrate normal sinus rhythm.  CONCLUSION: This is a  normal awake EEG.  There is no electrodiagnostic evidence of epileptiform discharge.  Danira Nylander, M.D. Ph.D.  [facility] [address] [place], [place] [address] Phone: [phone] Fax:      [phone]

## 2024-03-24 ENCOUNTER — Other Ambulatory Visit: Payer: Self-pay

## 2024-03-24 NOTE — Progress Notes (Signed)
 Specialty Pharmacy Refill Coordination Note  Amber Clarke is a 48 y.o. female contacted today regarding refills of specialty medication(s) Dupilumab  (Dupixent )   Patient requested Delivery   Delivery date: 04/06/24   Verified address: [address]   Medication will be filled on 04/05/24.

## 2024-03-24 NOTE — Progress Notes (Signed)
 Specialty Pharmacy Ongoing Clinical Assessment Note  Amber Clarke is a 48 y.o. female who is being followed by the specialty pharmacy service for RxSp Asthma/COPD   Patient's specialty medication(s) reviewed today: Dupilumab  (Dupixent )   Missed doses in the last 4 weeks: 0   Patient/Caregiver did not have any additional questions or concerns.   Therapeutic benefit summary: Patient is achieving benefit   Adverse events/side effects summary: No adverse events/side effects   Patient's therapy is appropriate to: Continue    Goals Addressed             This Visit's Progress    Minimize recurrence of flares       Patient is on track but experiencing increase in symptoms about a week and a half after injection as it is no longer lasting the full 2 weeks. Patient will maintain adherence and be assessed at upcoming appointment to see if a change in therapy is needed.  Patient reports that she is generally well-controlled at this time but feels symptoms start to return before next injection is due.          Follow up: 12 months  Rena Carnes Specialty Pharmacist

## 2024-04-05 ENCOUNTER — Other Ambulatory Visit: Payer: Self-pay

## 2024-04-05 ENCOUNTER — Telehealth: Payer: Self-pay | Admitting: Pharmacist

## 2024-04-05 NOTE — Progress Notes (Signed)
 Submitted an URGENT Prior Authorization RENEWAL request to [facility] for DUPIXENT  via CoverMyMeds. Will update once we receive a response.  Key: [id number]

## 2024-04-05 NOTE — Telephone Encounter (Signed)
 Received notification from [facility] regarding a prior authorization for DUPIXENT . Authorization has been APPROVED from 04/05/2024 to 04/05/2025. Approval letter sent to scan center.  Patient can continue to fill through [facility]: [phone]   Authorization # [id number]

## 2024-04-05 NOTE — Progress Notes (Signed)
PA for Dupixent approved

## 2024-04-05 NOTE — Telephone Encounter (Signed)
 Submitted an URGENT Prior Authorization RENEWAL request to [facility] for DUPIXENT  via CoverMyMeds. Will update once we receive a response.  Key: [id number]

## 2024-04-06 ENCOUNTER — Other Ambulatory Visit: Payer: Self-pay

## 2024-04-08 ENCOUNTER — Encounter: Payer: Self-pay | Admitting: Pulmonary Disease

## 2024-04-08 ENCOUNTER — Ambulatory Visit: Admitting: Pulmonary Disease

## 2024-04-08 VITALS — BP 114/79 | HR 114 | Ht 59.0 in | Wt 176.6 lb

## 2024-04-08 DIAGNOSIS — J849 Interstitial pulmonary disease, unspecified: Secondary | ICD-10-CM | POA: Diagnosis not present

## 2024-04-08 DIAGNOSIS — Z87891 Personal history of nicotine dependence: Secondary | ICD-10-CM | POA: Diagnosis not present

## 2024-04-08 DIAGNOSIS — J4540 Moderate persistent asthma, uncomplicated: Secondary | ICD-10-CM

## 2024-04-08 DIAGNOSIS — J4551 Severe persistent asthma with (acute) exacerbation: Secondary | ICD-10-CM | POA: Diagnosis not present

## 2024-04-08 MED ORDER — VARENICLINE TARTRATE (STARTER) 0.5 MG X 11 & 1 MG X 42 PO TBPK: 1.0000 | ORAL_TABLET | Freq: Every day | ORAL | 2 refills | Status: AC

## 2024-04-08 MED ORDER — ALBUTEROL SULFATE HFA 108 (90 BASE) MCG/ACT IN AERS
2.0000 | INHALATION_SPRAY | Freq: Four times a day (QID) | RESPIRATORY_TRACT | 6 refills | Status: AC | PRN
Start: 2024-04-08 — End: ?

## 2024-04-08 MED ORDER — PREDNISONE 10 MG PO TABS: 40.0000 mg | ORAL_TABLET | Freq: Every day | ORAL | 0 refills | Status: AC

## 2024-04-08 MED ORDER — BREZTRI AEROSPHERE 160-9-4.8 MCG/ACT IN AERO: 2.0000 | INHALATION_SPRAY | Freq: Two times a day (BID) | RESPIRATORY_TRACT | 3 refills | Status: AC

## 2024-04-08 MED ORDER — NICOTINE 7 MG/24HR TD PT24: 7.0000 mg | MEDICATED_PATCH | Freq: Every day | TRANSDERMAL | 11 refills | Status: AC

## 2024-04-08 NOTE — Patient Instructions (Signed)
 VISIT SUMMARY:  During your visit, we discussed your worsening asthma symptoms and reviewed your current treatment plan. We also addressed your mold exposure, COPD, and efforts to quit smoking.  YOUR PLAN:  -ASTHMA: Asthma is a condition where your airways narrow and swell, producing extra mucus, which can make breathing difficult. Your asthma is under reasonable control with Dupixent , but symptoms return after a week and a half. We will prescribe a short course of prednisone  for exacerbations and consider adding an Ohtuvayre  nebulizer to improve lung function and reduce inflammation. A chest CT will be ordered to evaluate your persistent symptoms and mold exposure.  -MOLD EXPOSURE: Mold exposure can cause respiratory symptoms and exacerbate asthma. You have significant mold exposure in your current residence. A chest CT will be ordered to evaluate the effects of mold exposure on your lung health.  -CHRONIC OBSTRUCTIVE PULMONARY DISEASE (COPD): COPD is a chronic inflammatory lung disease that obstructs airflow from the lungs, often caused by long-term exposure to irritating gases or particulate matter, most often from cigarette smoke. Your COPD is managed with the Breztri  inhaler, and we will consider adding an [place] nebulizer to improve lung function and reduce inflammation.  -TOBACCO USE DISORDER: Tobacco use disorder is a dependence on tobacco products. You are currently smoking five cigarettes per day and are attempting to quit. We will prescribe Chantix  to aid in smoking cessation and encourage you to continue your efforts to quit smoking.  INSTRUCTIONS:  Please follow up with the chest CT as ordered to evaluate your lung health and the effects of mold exposure. Continue using your current medications as prescribed, and start the short course of prednisone  for asthma exacerbations. Begin using Chantix  to help with smoking cessation. Consider moving to a new residence to reduce mold exposure.  Follow up with us  to review your progress and any new symptoms.

## 2024-04-08 NOTE — Progress Notes (Addendum)
 Amber Clarke    [id number]    1976/09/13  Primary Care Physician:Vanstory, Rosina SAILOR, PA  Referring Physician: Rosalea Rosina SAILOR, PA [address] [place],  [place] [address]  Chief complaint: Follow-up for COPD, asthma  HPI: 48 y.o. who  has a past medical history of Asthma and Seizures (HCC).  Discussed the use of AI scribe software for clinical note transcription with the patient, who gave verbal consent to proceed.  History of Present Illness Amber Clarke is a 48 year old female with asthma and COPD who presents with worsening asthma symptoms.  She has a history of asthma and COPD, with her asthma previously well-controlled on Dupixent . However, she now experiences increased symptoms after a week and a half of treatment. Prior to starting Dupixent , her asthma was poorly controlled. She uses Breztri  and Singulair , which she finds helpful, and takes albuterol  daily, with usage varying based on her activity level. She uses albuterol  twice on light activity days and more frequently on busy days.  She is attempting to quit smoking. She has switched from smoking marijuana to consuming it in edible forms such as gummies and brownies.   Pets: No pets Occupation: Works for patient care and previously in [employer] Exposures:She lives in a home with significant mold exposure, which she describes as 'horrible' and visible on the walls. She has lived there for four to five years and is planning to move. No exposure to hot tubs, jacuzzis, feather pillows, or comforters. No h/o chemo/XRT/amiodarone/macrodantin/MTX  No exposure to asbestos, silica or other organic allergens  Smoking history:he has been smoking for about 17 years and is currently smoking five cigarettes a day, having previously reduced to one cigarette a day.  Travel history: No significant travel history Relevant family history: No family history of lung disease  Outpatient Encounter Medications as of 04/08/2024   Medication Sig   albuterol  (PROVENTIL  HFA) 108 (90 Base) MCG/ACT inhaler Inhale 2 puffs into the lungs every 6 (six) hours as needed for wheezing or shortness of breath.   albuterol  (PROVENTIL ) (2.5 MG/3ML) 0.083% nebulizer solution Take 3 mLs (2.5 mg total) by nebulization every 6 (six) hours as needed for wheezing or shortness of breath.   ALPRAZolam (XANAX) 1 MG tablet Take 1 mg by mouth 2 (two) times daily.   azithromycin  (ZITHROMAX ) 250 MG tablet Per zpack instructions   Budeson-Glycopyrrol-Formoterol (BREZTRI  AEROSPHERE) 160-9-4.8 MCG/ACT AERO Inhale 2 puffs into the lungs in the morning and at bedtime.   diclofenac  Sodium (VOLTAREN ) 1 % GEL Apply 2-4 g topically daily.   Dupilumab  (DUPIXENT ) 300 MG/2ML SOAJ Inject 300 mg into the skin every 14 (fourteen) days.   famotidine (PEPCID) 20 MG tablet Take 20 mg by mouth daily.   fluticasone  (FLONASE ) 50 MCG/ACT nasal spray PLACE 1 SPRAY INTO BOTH NOSTRILS DAILY.   ipratropium-albuterol  (DUONEB) 0.5-2.5 (3) MG/3ML SOLN Take 3 mLs by nebulization every 6 (six) hours as needed.   levETIRAcetam  (KEPPRA ) 1000 MG tablet Take 1 tablet (1,000 mg total) by mouth 2 (two) times daily.   meloxicam  (MOBIC ) 7.5 MG tablet TAKE 1 TABLET (7.5 MG TOTAL) BY MOUTH DAILY.   montelukast  (SINGULAIR ) 10 MG tablet Take 1 tablet (10 mg total) by mouth at bedtime.   nicotine  (NICODERM CQ  - DOSED IN MG/24 HR) 7 mg/24hr patch PLACE 1 PATCH (7 MG TOTAL) ONTO THE SKIN DAILY.   nystatin  (MYCOSTATIN ) 100000 UNIT/ML suspension Take 5 mLs (500,000 Units total) by mouth 4 (four)  times daily.   omeprazole (PRILOSEC) 20 MG capsule Take 20 mg by mouth daily.   predniSONE  (DELTASONE ) 10 MG tablet 4 tabs for 3 days, then 3 tabs for 3 days, 2 tabs for 3 days, then 1 tab for 3 days, then stop   sertraline (ZOLOFT) 50 MG tablet Take by mouth.   Spacer/Aero-Holding Chambers DEVI 1 each by Does not apply route in the morning and at bedtime. Please use with MDI inhalers   tiZANidine   (ZANAFLEX ) 4 MG tablet TAKE 1 TABLET (4 MG TOTAL) BY MOUTH EVERY 6 (SIX) HOURS AS NEEDED FOR MUSCLE SPASMS.   varenicline  (CHANTIX ) 1 MG tablet TAKE 1 TABLET (1 MG TOTAL) BY MOUTH 2 (TWO) TIMES DAILY.   Facility-Administered Encounter Medications as of 04/08/2024  Medication   methylPREDNISolone  acetate (DEPO-MEDROL ) injection 80 mg    Physical Exam: Blood pressure 114/79, pulse (!) 114, height 4' 11 (1.499 m), weight 176 lb 9.6 oz (80.1 kg), SpO2 96%. Gen:      No acute distress HEENT:  EOMI, sclera anicteric Neck:     No masses; no thyromegaly Lungs:    Clear to auscultation bilaterally; normal respiratory effort CV:         Regular rate and rhythm; no murmurs Abd:      + bowel sounds; soft, non-tender; no palpable masses, no distension Ext:    No edema; adequate peripheral perfusion Skin:      Warm and dry; no rash Neuro: alert and oriented x 3 Psych: normal mood and affect  Data Reviewed: Imaging: High-res CT 03/14/2022-no evidence of fibrotic interstitial lung disease.  Mild diffuse bronchial wall thickening, mild emphysema I have reviewed the images personally.  PFTs: 02/06/2022 FVC 1.91 [94%], FEV1 0.93 [44%], F/F49, TLC 4.36 [97%], DLCO 6.03 [32%] Severe obstruction, air trapping Severe diffusion impairment  FENO 04/08/2024-attempted but patient could not complete  Labs: CBC 03/20/2022-WBC 9.0, eos 4.6%, absolute eosinophil count 414 IgE 03/20/2022-59 Aspergillus IgE panel 03/20/2022-negative   Sleep: Sleep study 12/31/2022- Nocturnal hypoxemia.  Desats to 82% Periodic limb movement with arousal.  Recommendation to use supplemental oxygen  during sleep, trial of Mirapex, Requip or Sinemet for PLMS Assessment & Plan Severe persistent asthma, COPD overlap syndrome Asthma is still uncontrolled with daily symptoms. Dupixent  has improved control significantly, but symptoms recur after a week and a half. Daily albuterol  use is required, with increased use during physical activity.  Persistent symptoms may be due to mold exposure. - Prescribe a short course of prednisone  40 mg a day for 5 days for exacerbations. - Add Ohtuvayre  nebulizer to improve lung function and reduce inflammation. - Order chest CT to evaluate for persistent symptoms and mold exposure. - Continue with breztri  inhaler, Singulair   Mold Exposure Significant mold exposure in current residence, visible on walls, may contribute to respiratory symptoms and asthma exacerbations. - Order chest CT to evaluate for effects of mold exposure on lung health.  Tobacco Use Disorder Currently smoking five cigarettes per day, with a history of smoking half a pack per day for 17 years. She is attempting to quit smoking and has reduced cigarette use. Transitioned from smoking marijuana to edibles.  Time spent counseling-5 minutes.  Reassess at return visit - Prescribe nicotine  patches and Chantix  to aid in smoking cessation. - Encourage continued efforts to quit smoking.  Recommendations: Prednisone  course Breztri , Singulair .  Start Ohtuvayre  CT chest Smoking cessation  Freeda Spivey MD  Pulmonary and Critical Care 04/08/2024, 3:34 PM  CC: Rosalea Rosina SAILOR, PA

## 2024-04-14 ENCOUNTER — Encounter: Payer: Self-pay | Admitting: Pulmonary Disease

## 2024-04-15 ENCOUNTER — Inpatient Hospital Stay: Admission: RE | Admit: 2024-04-15 | Source: Ambulatory Visit

## 2024-04-16 ENCOUNTER — Telehealth: Payer: Self-pay

## 2024-04-16 NOTE — Telephone Encounter (Signed)
 Received Ohtuvayre new start paperwork. Completed form and faxed with clinicals and insurance card copy to [facility]   Phone#: [phone] Fax#: [phone]

## 2024-04-21 NOTE — Telephone Encounter (Signed)
 Received fax from [employer] with summary of benefits. Referral form for Ohtuvayre  received. Rx will be triaged to [facility].. Once benefits investigation completed, pharmacy will reach out the patient to schedule shipment. If medication is unaffordable, patient will need to express financial hardship to be referred back to [facility] for patient assistance program pre-screening.   Patient ID: [id number] Pharmacy phone: [phone] [facility] Phone#: [phone]

## 2024-04-21 NOTE — Telephone Encounter (Signed)
 Submitted a Prior Authorization request to [facility] for OHTUVAYRE  via CoverMyMeds. Will update once we receive a response.  Key: Amber Clarke

## 2024-04-26 ENCOUNTER — Other Ambulatory Visit (HOSPITAL_COMMUNITY): Payer: Self-pay

## 2024-04-28 ENCOUNTER — Other Ambulatory Visit: Payer: Self-pay

## 2024-04-28 NOTE — Progress Notes (Signed)
 Specialty Pharmacy Refill Coordination Note  Amber Clarke is a 48 y.o. female contacted today regarding refills of specialty medication(s) Dupilumab  (Dupixent )   Patient requested Delivery   Delivery date: 04/29/24   Verified address: [address] [name] [address]   Medication will be filled on 04/28/24.

## 2024-05-03 ENCOUNTER — Telehealth (HOSPITAL_BASED_OUTPATIENT_CLINIC_OR_DEPARTMENT_OTHER): Payer: Self-pay

## 2024-05-03 NOTE — Telephone Encounter (Signed)
 Received a notice on CMM regarding Prior Authorization from [facility] for OHTUVAYRE . Authorization has been DENIED because must trial and fail more than one opreferred drug: Anoro, Atrovent  HFA, Combivent Respimat, Incruse Ellipta, ipra nebs, ipra-albuterol , roflumilast. Called insurance for denial letter  Rep will fax denial letter so we can proceed with appeal  Sherry Pennant, PharmD, MPH, BCPS, CPP Clinical Pharmacist (Rheumatology and Pulmonology)

## 2024-05-03 NOTE — Telephone Encounter (Signed)
 Copied from CRM [id number]. Topic: Referral - Prior Authorization Question >> Apr 30, 2024  2:58 PM Celestine FALCON wrote: Reason for CRM: Wonda from [facility] stated the pt's prescription for Ohtuvayre  had a prior authorization that was denied. She stated there was a denial note and appeal note faxed to the clinic on 04/28/2024. She is requesting an update if the clinic will be appealing, not appealing, prescribing alternative etc. Please follow up with fax [phone] and phone number [phone] either fax or call for a follow up on info regarding an appeal.

## 2024-05-03 NOTE — Telephone Encounter (Signed)
 We will be completing appeal once we received denial from insurance. Messag sent to [employer] regaridnf this

## 2024-05-21 ENCOUNTER — Other Ambulatory Visit: Payer: Self-pay

## 2024-05-21 NOTE — Progress Notes (Signed)
 Specialty Pharmacy Refill Coordination Note  Amber Clarke is a 48 y.o. female contacted today regarding refills of specialty medication(s) Dupilumab  (Dupixent )   Patient requested Delivery   Delivery date: 05/27/24   Verified address: [address] [name] [address]   Medication will be filled on 05/26/24.

## 2024-06-10 NOTE — Telephone Encounter (Signed)
 Appeal for Ohtuvayre  likely to be denied. We had another patient in same situation for whom the denial was upheld  Sherry Pennant, PharmD, MPH, BCPS, CPP Clinical Pharmacist (Rheumatology and Pulmonology)

## 2024-06-16 ENCOUNTER — Encounter (INDEPENDENT_AMBULATORY_CARE_PROVIDER_SITE_OTHER): Payer: Self-pay

## 2024-06-16 ENCOUNTER — Other Ambulatory Visit: Payer: Self-pay | Admitting: Pharmacy Technician

## 2024-06-16 ENCOUNTER — Other Ambulatory Visit: Payer: Self-pay

## 2024-06-16 NOTE — Progress Notes (Signed)
 Specialty Pharmacy Refill Coordination Note  Amber Clarke is a 48 y.o. female contacted today regarding refills of specialty medication(s) Dupilumab  (Dupixent )   Patient requested (Patient-Rptd) Delivery   Delivery date: 06/18/24  Verified address: (Patient-Rptd) [address]   Medication will be filled on 06/17/24.

## 2024-06-17 ENCOUNTER — Other Ambulatory Visit: Payer: Self-pay

## 2024-06-28 ENCOUNTER — Other Ambulatory Visit: Payer: Self-pay | Admitting: Neurology

## 2024-06-28 DIAGNOSIS — R569 Unspecified convulsions: Secondary | ICD-10-CM

## 2024-07-12 ENCOUNTER — Other Ambulatory Visit: Payer: Self-pay | Admitting: Pharmacy Technician

## 2024-07-12 ENCOUNTER — Other Ambulatory Visit: Payer: Self-pay | Admitting: Internal Medicine

## 2024-07-12 ENCOUNTER — Other Ambulatory Visit: Payer: Self-pay

## 2024-07-12 ENCOUNTER — Encounter (INDEPENDENT_AMBULATORY_CARE_PROVIDER_SITE_OTHER): Payer: Self-pay

## 2024-07-12 DIAGNOSIS — J4551 Severe persistent asthma with (acute) exacerbation: Secondary | ICD-10-CM

## 2024-07-12 NOTE — Telephone Encounter (Signed)
 Pt requesting refill of specialty medication, routing to Rx team to advise.

## 2024-07-12 NOTE — Progress Notes (Signed)
 Specialty Pharmacy Refill Coordination Note  Amber Clarke is a 48 y.o. female contacted today regarding refills of specialty medication(s)  Dupilumab  (Dupixent     Patient requested (Patient-Rptd) Delivery   Delivery date: 07/14/24 Verified address: (Patient-Rptd) [address]   Medication will be filled on 07/13/24.   RR sent to MD.

## 2024-07-13 ENCOUNTER — Other Ambulatory Visit (HOSPITAL_COMMUNITY): Payer: Self-pay

## 2024-07-13 ENCOUNTER — Other Ambulatory Visit: Payer: Self-pay | Admitting: Internal Medicine

## 2024-07-13 ENCOUNTER — Other Ambulatory Visit: Payer: Self-pay

## 2024-07-13 DIAGNOSIS — J4551 Severe persistent asthma with (acute) exacerbation: Secondary | ICD-10-CM

## 2024-07-13 MED ORDER — DUPIXENT 300 MG/2ML ~~LOC~~ SOAJ
300.0000 mg | SUBCUTANEOUS | 3 refills | Status: DC
  Filled 2024-07-13: qty 4, 28d supply, fill #0
  Filled 2024-08-05: qty 4, 28d supply, fill #1
  Filled 2024-09-02: qty 4, 28d supply, fill #2
  Filled 2024-10-11: qty 4, 28d supply, fill #3

## 2024-07-13 NOTE — Telephone Encounter (Signed)
 Refill sent for DUPIXENT  to [facility]: [phone]   Dose: 300mg  [place] every 14 days   Last OV: 04/08/24 Provider: Dr. Theophilus  Next OV: due Sept 2025  Routing to scheduling team for follow-up on appt scheduling  Aleck Puls, PharmD, BCPS Clinical Pharmacist  [facility]

## 2024-07-13 NOTE — Telephone Encounter (Signed)
 Pt requesting refill of specialty medication - routing to Rx team to advise.

## 2024-07-16 NOTE — Telephone Encounter (Signed)
 Called patient and got her scheduled 10/8 w/ Mannam, NFN.

## 2024-07-21 ENCOUNTER — Ambulatory Visit: Admitting: Pulmonary Disease

## 2024-08-03 ENCOUNTER — Other Ambulatory Visit: Payer: Self-pay

## 2024-08-05 ENCOUNTER — Other Ambulatory Visit (HOSPITAL_COMMUNITY): Payer: Self-pay

## 2024-08-09 ENCOUNTER — Other Ambulatory Visit (HOSPITAL_COMMUNITY): Payer: Self-pay

## 2024-08-09 ENCOUNTER — Other Ambulatory Visit: Payer: Self-pay

## 2024-08-09 NOTE — Progress Notes (Signed)
 Specialty Pharmacy Refill Coordination Note  Amber Clarke is a 48 y.o. female contacted today regarding refills of specialty medication(s) Dupilumab  (Dupixent )   Patient requested Delivery   Delivery date: 08/11/24   Verified address: [address] [name] [address]   Medication will be filled on: 08/10/24

## 2024-09-02 ENCOUNTER — Other Ambulatory Visit: Payer: Self-pay

## 2024-09-02 ENCOUNTER — Ambulatory Visit (HOSPITAL_COMMUNITY): Payer: Self-pay

## 2024-09-03 ENCOUNTER — Ambulatory Visit (HOSPITAL_COMMUNITY): Payer: Self-pay

## 2024-09-07 ENCOUNTER — Other Ambulatory Visit (HOSPITAL_COMMUNITY): Payer: Self-pay

## 2024-09-10 ENCOUNTER — Other Ambulatory Visit: Payer: Self-pay

## 2024-09-10 NOTE — Progress Notes (Signed)
 Specialty Pharmacy Refill Coordination Note  Amber Clarke is a 48 y.o. female contacted today regarding refills of specialty medication(s) Dupilumab  (Dupixent )   Patient requested Delivery   Delivery date: 09/21/24   Verified address: [address] [name] [address]   Medication will be filled on: 09/20/24

## 2024-09-16 ENCOUNTER — Other Ambulatory Visit: Payer: Self-pay

## 2024-09-20 ENCOUNTER — Other Ambulatory Visit: Payer: Self-pay

## 2024-10-11 ENCOUNTER — Other Ambulatory Visit: Payer: Self-pay

## 2024-10-13 ENCOUNTER — Other Ambulatory Visit: Payer: Self-pay

## 2024-10-13 ENCOUNTER — Other Ambulatory Visit (HOSPITAL_COMMUNITY): Payer: Self-pay

## 2024-10-13 NOTE — Progress Notes (Signed)
 Specialty Pharmacy Refill Coordination Note  Amber Clarke is a 48 y.o. female contacted today regarding refills of specialty medication(s) Dupilumab  (Dupixent )   Patient requested Delivery   Delivery date: 10/20/23   Verified address: [address]   Medication will be filled on: 10/18/24  Patient reported 1.2.26 as next injection date but medication will not be delivered until 1.6.26 due to holiday and no weekend delivery.

## 2024-10-18 ENCOUNTER — Other Ambulatory Visit: Payer: Self-pay

## 2024-11-05 ENCOUNTER — Other Ambulatory Visit: Payer: Self-pay

## 2024-11-05 ENCOUNTER — Other Ambulatory Visit: Payer: Self-pay | Admitting: Pulmonary Disease

## 2024-11-05 DIAGNOSIS — J4551 Severe persistent asthma with (acute) exacerbation: Secondary | ICD-10-CM

## 2024-11-08 ENCOUNTER — Encounter (INDEPENDENT_AMBULATORY_CARE_PROVIDER_SITE_OTHER): Payer: Self-pay

## 2024-11-08 ENCOUNTER — Other Ambulatory Visit: Payer: Self-pay

## 2024-11-08 MED ORDER — DUPIXENT 300 MG/2ML ~~LOC~~ SOAJ
300.0000 mg | SUBCUTANEOUS | 3 refills | Status: AC
  Filled 2024-11-08 (×2): qty 4, 28d supply, fill #0

## 2024-11-08 NOTE — Telephone Encounter (Signed)
 Refill sent for DUPIXENT  to [facility]: [phone]   Dose: 300mg  [place] every 14 days  Last OV: 04/08/24 Provider: Dr. Theophilus  Next OV: overdue - last visit no-show in Sept 2025  Routing to scheduling team for follow-up on appt scheduling  Aleck Puls, PharmD, BCPS Clinical Pharmacist  [facility]

## 2024-11-09 ENCOUNTER — Other Ambulatory Visit (HOSPITAL_COMMUNITY): Payer: Self-pay

## 2024-11-09 NOTE — Progress Notes (Signed)
 Specialty Pharmacy Refill Coordination Note  Amber Clarke is a 49 y.o. female contacted today regarding refills of specialty medication(s) Dupilumab  (Dupixent )   Patient requested Delivery   Delivery date: 11/12/24   Verified address: [address]   Medication will be filled on: 11/11/24

## 2024-11-11 ENCOUNTER — Other Ambulatory Visit: Payer: Self-pay

## 2024-12-01 ENCOUNTER — Ambulatory Visit: Admitting: Pulmonary Disease

## 2024-12-02 ENCOUNTER — Telehealth: Admitting: Adult Health
